# Patient Record
Sex: Male | Born: 1960
Health system: Southern US, Community
[De-identification: ages and names within clinical notes are randomized; demographics above are authoritative.]

## PROBLEM LIST (undated history)

## (undated) DIAGNOSIS — K219 Gastro-esophageal reflux disease without esophagitis: Secondary | ICD-10-CM

## (undated) DIAGNOSIS — R109 Unspecified abdominal pain: Secondary | ICD-10-CM

## (undated) HISTORY — DX: Unspecified abdominal pain: R10.9

## (undated) HISTORY — PX: POLYPECTOMY: SHX149

## (undated) HISTORY — PX: COLONOSCOPY: SHX174

---
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
## 1993-11-04 HISTORY — PX: KNEE ARTHROSCOPY W/ ACL RECONSTRUCTION: SHX1858

## 2005-11-04 HISTORY — PX: VASECTOMY: SHX75

## 2008-02-15 ENCOUNTER — Encounter: Payer: Self-pay | Admitting: Family Medicine

## 2009-02-14 ENCOUNTER — Ambulatory Visit: Payer: Self-pay | Admitting: Family Medicine

## 2009-02-14 DIAGNOSIS — R109 Unspecified abdominal pain: Secondary | ICD-10-CM | POA: Insufficient documentation

## 2009-02-14 HISTORY — DX: Unspecified abdominal pain: R10.9

## 2009-02-14 LAB — CONVERTED CEMR LAB
Bilirubin Urine: NEGATIVE
Blood in Urine, dipstick: NEGATIVE
Glucose, Urine, Semiquant: NEGATIVE
Ketones, urine, test strip: NEGATIVE
Nitrite: NEGATIVE
Specific Gravity, Urine: 1.025
Urobilinogen, UA: 0.2
WBC Urine, dipstick: NEGATIVE
pH: 5.5

## 2009-02-15 LAB — CONVERTED CEMR LAB
ALT: 26 units/L (ref 0–53)
AST: 17 units/L (ref 0–37)
Albumin: 4.3 g/dL (ref 3.5–5.2)
BUN: 13 mg/dL (ref 6–23)
Basophils Absolute: 0 10*3/uL (ref 0.0–0.1)
Chloride: 108 meq/L (ref 96–112)
Eosinophils Relative: 2.9 % (ref 0.0–5.0)
Glucose, Bld: 98 mg/dL (ref 70–99)
HCT: 42 % (ref 39.0–52.0)
Hemoglobin: 14.7 g/dL (ref 13.0–17.0)
Lymphs Abs: 1.8 10*3/uL (ref 0.7–4.0)
MCV: 89.1 fL (ref 78.0–100.0)
Monocytes Absolute: 0.5 10*3/uL (ref 0.1–1.0)
Neutro Abs: 2.8 10*3/uL (ref 1.4–7.7)
PSA: 0.47 ng/mL (ref 0.10–4.00)
Platelets: 234 10*3/uL (ref 150.0–400.0)
Potassium: 4.3 meq/L (ref 3.5–5.1)
RDW: 11.8 % (ref 11.5–14.6)
Sodium: 142 meq/L (ref 135–145)
TSH: 2.08 microintl units/mL (ref 0.35–5.50)
Total Bilirubin: 1 mg/dL (ref 0.3–1.2)

## 2009-11-28 ENCOUNTER — Telehealth: Payer: Self-pay | Admitting: Family Medicine

## 2009-11-29 ENCOUNTER — Ambulatory Visit: Payer: Self-pay | Admitting: Family Medicine

## 2009-11-29 DIAGNOSIS — N4889 Other specified disorders of penis: Secondary | ICD-10-CM | POA: Insufficient documentation

## 2009-11-29 LAB — CONVERTED CEMR LAB
Blood in Urine, dipstick: NEGATIVE
Ketones, urine, test strip: NEGATIVE
Nitrite: NEGATIVE
Protein, U semiquant: NEGATIVE
Urobilinogen, UA: 0.2
WBC Urine, dipstick: NEGATIVE

## 2009-12-18 ENCOUNTER — Encounter: Payer: Self-pay | Admitting: Family Medicine

## 2010-02-09 ENCOUNTER — Ambulatory Visit: Payer: Self-pay | Admitting: Family Medicine

## 2010-02-09 DIAGNOSIS — J029 Acute pharyngitis, unspecified: Secondary | ICD-10-CM

## 2010-02-09 LAB — CONVERTED CEMR LAB: Rapid Strep: NEGATIVE

## 2010-02-14 ENCOUNTER — Ambulatory Visit: Payer: Self-pay | Admitting: Family Medicine

## 2010-03-15 ENCOUNTER — Encounter: Payer: Self-pay | Admitting: Family Medicine

## 2010-10-08 ENCOUNTER — Ambulatory Visit: Payer: Self-pay | Admitting: Family Medicine

## 2010-10-08 DIAGNOSIS — M545 Low back pain: Secondary | ICD-10-CM

## 2010-10-08 LAB — CONVERTED CEMR LAB
Blood in Urine, dipstick: NEGATIVE
Ketones, urine, test strip: NEGATIVE
Nitrite: NEGATIVE
Protein, U semiquant: NEGATIVE
Specific Gravity, Urine: 1.015
Urobilinogen, UA: 0.2
WBC Urine, dipstick: NEGATIVE

## 2010-11-19 ENCOUNTER — Ambulatory Visit
Admission: RE | Admit: 2010-11-19 | Discharge: 2010-11-19 | Payer: Self-pay | Source: Home / Self Care | Attending: Family Medicine | Admitting: Family Medicine

## 2010-12-01 ENCOUNTER — Encounter: Payer: Self-pay | Admitting: Family Medicine

## 2010-12-04 NOTE — Progress Notes (Signed)
Summary: question for Dr.Milford Cilento  Phone Note Call from Patient   Caller: Patient Call For: Evelena Peat MD Summary of Call: Pt is calling to ask Dr. Caryl Never if he should see a UROLOGIST for a mass in his penis or come straight to Dr. Caryl Never? [phone] Initial call taken by: Lynann Beaver CMA,  November 28, 2009 1:06 PM  Follow-up for Phone Call        We can evaluate. Follow-up by: Evelena Peat MD,  November 28, 2009 1:39 PM

## 2010-12-04 NOTE — Assessment & Plan Note (Signed)
Summary: back pain//ccm   Vital Signs:  Patient profile:   50 year old male Weight:      217 pounds Temp:     98.7 degrees F oral BP sitting:   110 / 80  (left arm) Cuff size:   large  Vitals Entered By: Sid Falcon LPN (October 08, 2010 4:08 PM)  History of Present Illness: Patient seen for low back pain which started 2 days ago Saturday. No specific injury but did lots of yard work. Pain mostly right lower lumbar area with some radiation to the buttock. Pain is mostly 4/10 in severity but occasionally 10 out of 10 severity with movement. No improvement with ice. Did not try heat. Ibuprofen 600 mg helped somewhat. Denies any dysuria, fever, chills, appetite change, or weight change.  Allergies (verified): No Known Drug Allergies  Review of Systems      See HPI  Physical Exam  General:  Well-developed,well-nourished,in no acute distress; alert,appropriate and cooperative throughout examination Lungs:  Normal respiratory effort, chest expands symmetrically. Lungs are clear to auscultation, no crackles or wheezes. Heart:  Normal rate and regular rhythm. S1 and S2 normal without gallop, murmur, click, rub or other extra sounds. Abdomen:  Bowel sounds positive,abdomen soft and non-tender without masses, organomegaly or hernias noted. Extremities:  no edema. Straight leg raise is negative Neurologic:  alert & oriented X3.  full strength with plantar flexion dorsiflexion bilaterally. 1+ Achilles reflexes bilaterally and 1+ patella reflexes bilateral. Normal sensation to touch. Skin:  no rashes and no suspicious lesions.     Impression & Recommendations:  Problem # 1:  LUMBAGO (ICD-724.2) nonfocal neuro.  Cont ibuprofen and tramadol as needed.   heat for symptom relief.  UA unremarkable. Orders: UA Dipstick w/o Micro (manual) (16109)  His updated medication list for this problem includes:    Tramadol Hcl 50 Mg Tabs (Tramadol hcl) .Marland Kitchen... 1-2 by mouth q 6 hours as needed  pain.  Complete Medication List: 1)  Daily Mens Health Formula Tabs (Multiple vitamins-minerals) .... Once daily 2)  Tramadol Hcl 50 Mg Tabs (Tramadol hcl) .Marland Kitchen.. 1-2 by mouth q 6 hours as needed pain.  Patient Instructions: 1)  Most patients (90%) with low back pain will improve with time ( 2-6 weeks). Keep active but avoid activities that are painful. Apply moist heat and/or ice to lower back several times a day.  Prescriptions: TRAMADOL HCL 50 MG TABS (TRAMADOL HCL) 1-2 by mouth q 6 hours as needed pain.  #60 x 1   Entered and Authorized by:   Evelena Peat MD   Signed by:   Evelena Peat MD on 10/08/2010   Method used:   Print then Give to Patient   RxID:   [id number]    Orders Added: 1)  Est. Patient Level III [95621] 2)  UA Dipstick w/o Micro (manual) [81002]    Laboratory Results   Urine Tests    Routine Urinalysis   Color: yellow Appearance: Clear Glucose: negative   (Normal Range: Negative) Bilirubin: negative   (Normal Range: Negative) Ketone: negative   (Normal Range: Negative) Spec. Gravity: 1.015   (Normal Range: 1.003-1.035) Blood: negative   (Normal Range: Negative) pH: 5.5   (Normal Range: 5.0-8.0) Protein: negative   (Normal Range: Negative) Urobilinogen: 0.2   (Normal Range: 0-1) Nitrite: negative   (Normal Range: Negative) Leukocyte Esterace: negative   (Normal Range: Negative)    Comments: Rita Ohara  October 08, 2010 4:16 PM

## 2010-12-04 NOTE — Assessment & Plan Note (Signed)
Summary: not any better//ccm   Vital Signs:  Patient profile:   50 year old male Temp:     98.6 degrees F oral BP sitting:   110 / 90  (left arm) Cuff size:   regular  Vitals Entered By: Sid Falcon LPN (February 14, 2010 1:47 PM) CC: not any better, ongoing congestion, cough   History of Present Illness: Persistent cough now productive of green sputum. Still has occasional sore throat symptoms. No dyspnea. Denies any fever or chills. Nonsmoker. Hydrocodone cough medication is helping at night.  No hemoptysis.  Allergies (verified): No Known Drug Allergies  Review of Systems      See HPI  Physical Exam  General:  Well-developed,well-nourished,in no acute distress; alert,appropriate and cooperative throughout examination Ears:  External ear exam shows no significant lesions or deformities.  Otoscopic examination reveals clear canals, tympanic membranes are intact bilaterally without bulging, retraction, inflammation or discharge. Hearing is grossly normal bilaterally. Mouth:  Oral mucosa and oropharynx without lesions or exudates.  Teeth in good repair. Neck:  No deformities, masses, or tenderness noted. Lungs:  Normal respiratory effort, chest expands symmetrically. Lungs are clear to auscultation, no crackles or wheezes. Heart:  Normal rate and regular rhythm. S1 and S2 normal without gallop, murmur, click, rub or other extra sounds.   Impression & Recommendations:  Problem # 1:  ACUTE BRONCHITIS (ICD-466.0)  His updated medication list for this problem includes:    Hydrocodone-homatropine 5-1.5 Mg/78ml Syrp (Hydrocodone-homatropine) .Marland Kitchen... 1 tsp by mouth q 4-6 hours as needed cough    Azithromycin 250 Mg Tabs (Azithromycin) .Marland Kitchen... 2 by mouth today then one by mouth once daily for 4 days  Complete Medication List: 1)  Daily Mens Health Formula Tabs (Multiple vitamins-minerals) .... Once daily 2)  Hydrocodone-homatropine 5-1.5 Mg/55ml Syrp (Hydrocodone-homatropine) .Marland Kitchen.. 1 tsp by  mouth q 4-6 hours as needed cough 3)  Azithromycin 250 Mg Tabs (Azithromycin) .... 2 by mouth today then one by mouth once daily for 4 days  Patient Instructions: 1)  Acute Bronchitis symptoms for less then 10 days are not  helped by antibiotics. Take over the counter cough medications. Call if no improvement in 5-7 days, sooner if increasing cough, fever, or new symptoms ( shortness of breath, chest pain) .  Prescriptions: AZITHROMYCIN 250 MG TABS (AZITHROMYCIN) 2 by mouth today then one by mouth once daily for 4 days  #6 x 0   Entered and Authorized by:   Evelena Peat MD   Signed by:   Evelena Peat MD on 02/14/2010   Method used:   Print then Give to Patient   RxID:   [id number]

## 2010-12-04 NOTE — Assessment & Plan Note (Signed)
Summary: lump in genital area/per Dr. Charlton Amor   Vital Signs:  Patient profile:   50 year old male Temp:     98.9 degrees F oral BP sitting:   110 / 84  (left arm) Cuff size:   regular  Vitals Entered By: Sid Falcon LPN (November 29, 2009 3:51 PM)   History of Present Illness: Patient is seen with recently noted "lump" dorsum of penis about one week ago. He has not had any dysuria or hematuria. Possibly minimal discomfort. No skin changes. No history of erectile dysfunction. Denies any curvature of the penis with erection.  Allergies (verified): No Known Drug Allergies  Past History:  Past Surgical History: Last updated: 02/14/2009 Tonsillectomy  1970 Vasectomy  2007 Knee ACL 1995  Social History: Last updated: 02/14/2009 Occupation: Consulting civil engineer  Married Never Smoked  Review of Systems  The patient denies anorexia, fever, weight loss, abdominal pain, hematuria, incontinence, suspicious skin lesions, enlarged lymph nodes, and testicular masses.    Physical Exam  General:  Well-developed,well-nourished,in no acute distress; alert,appropriate and cooperative throughout examination Genitalia:  circumcised male. Glans appears nl.  He has some essentially nontender fibrous type tissue dorsal penis but no evidence for curvature.  no scrotal masses. Testes normal. No hernia. Skin:  Intact without suspicious lesions or rashes   Impression & Recommendations:  Problem # 1:  PENILE PAIN (ICD-607.89)  urinalysis unremarkable. Suspect benign fibrous tissue but will schedule urology appointment for second opinion.  No evidence for Peyronie's  Orders: UA Dipstick w/o Micro (manual) (30160) Urology Referral (Urology)  Complete Medication List: 1)  Daily Mens Health Formula Tabs (Multiple vitamins-minerals) .... Once daily  Laboratory Results   Urine Tests    Routine Urinalysis   Color: lt. yellow Appearance: Clear Glucose: negative   (Normal Range:  Negative) Bilirubin: negative   (Normal Range: Negative) Ketone: negative   (Normal Range: Negative) Spec. Gravity: <1.005   (Normal Range: 1.003-1.035) Blood: negative   (Normal Range: Negative) pH: 6.5   (Normal Range: 5.0-8.0) Protein: negative   (Normal Range: Negative) Urobilinogen: 0.2   (Normal Range: 0-1) Nitrite: negative   (Normal Range: Negative) Leukocyte Esterace: negative   (Normal Range: Negative)    Comments: Sid Falcon LPN  November 29, 2009 4:07 PM

## 2010-12-04 NOTE — Letter (Signed)
Summary: [facility]  [facility]   Imported By: Maryln Gottron 03/29/2010 14:07:09  _____________________________________________________________________  External Attachment:    Type:   Image     Comment:   External Document

## 2010-12-04 NOTE — Consult Note (Signed)
Summary: [facility]  [facility]   Imported By: Maryln Gottron 12/27/2009 10:56:30  _____________________________________________________________________  External Attachment:    Type:   Image     Comment:   External Document

## 2010-12-04 NOTE — Assessment & Plan Note (Signed)
Summary: cough/chest congestion/sore throat/cjr   Vital Signs:  Patient profile:   50 year old male Temp:     98.9 degrees F oral BP sitting:   110 / 82  (left arm) Cuff size:   large  Vitals Entered By: Sid Falcon LPN (February 09, 453 1:48 PM) CC: cough, chest congestion, sore throat   History of Present Illness: Onset 5 days ago chest congestion, mostly dry cough and intermittent sore throat. No fever. TheraFlu which helped his sore throat symptoms. Cough has persisted at night. No ill exposures. Denies any nausea, vomiting, or diarrhea. Nonsmoker. Sore throat symptoms worse after coughing.  Allergies (verified): No Known Drug Allergies  Review of Systems      See HPI  Physical Exam  General:  Well-developed,well-nourished,in no acute distress; alert,appropriate and cooperative throughout examination Ears:  External ear exam shows no significant lesions or deformities.  Otoscopic examination reveals clear canals, tympanic membranes are intact bilaterally without bulging, retraction, inflammation or discharge. Hearing is grossly normal bilaterally. Nose:  External nasal examination shows no deformity or inflammation. Nasal mucosa are pink and moist without lesions or exudates. Mouth:  evidence for previous tonsillectomy. Mild erythema without exudate Neck:  No deformities, masses, or tenderness noted. Lungs:  Normal respiratory effort, chest expands symmetrically. Lungs are clear to auscultation, no crackles or wheezes. Heart:  Normal rate and regular rhythm. S1 and S2 normal without gallop, murmur, click, rub or other extra sounds.   Impression & Recommendations:  Problem # 1:  VIRAL URI (ICD-465.9)  rapid strep negative. Take Alleve or Advil for symptom relief and cough medicine prescribed  His updated medication list for this problem includes:    Hydrocodone-homatropine 5-1.5 Mg/8ml Syrp (Hydrocodone-homatropine) .Marland Kitchen... 1 tsp by mouth q 4-6 hours as needed  cough  Complete Medication List: 1)  Daily Mens Health Formula Tabs (Multiple vitamins-minerals) .... Once daily 2)  Hydrocodone-homatropine 5-1.5 Mg/90ml Syrp (Hydrocodone-homatropine) .Marland Kitchen.. 1 tsp by mouth q 4-6 hours as needed cough  Other Orders: Rapid Strep (09811)  Patient Instructions: 1)  Consider Aleve 2 tablets every 12 hours as needed for sore throat symptoms Prescriptions: HYDROCODONE-HOMATROPINE 5-1.5 MG/5ML SYRP (HYDROCODONE-HOMATROPINE) 1 tsp by mouth q 4-6 hours as needed cough  #120 ml x 0   Entered and Authorized by:   Evelena Peat MD   Signed by:   Evelena Peat MD on 02/09/2010   Method used:   Print then Give to Patient   RxID:   [id number]   Laboratory Results  Date/Time Received: February 09, 2010 1:59 PM  Date/Time Reported: February 09, 2010 1:59 PM   Other Tests  Rapid Strep: negative Comments Wynona Canes, CMA  February 09, 2010 1:59 PM

## 2010-12-06 NOTE — Assessment & Plan Note (Signed)
Summary: injs for trip to [place] and script/forms per nancy/cjr   Vital Signs:  Patient profile:   50 year old male Temp:     98.1 degrees F oral BP sitting:   112 / 82  (left arm) Cuff size:   regular  Vitals Entered By: Sid Falcon LPN (November 19, 2010 12:24 PM)  History of Present Illness: Here for upcoming trip to [place] and needing to consult for immunizations.  Tetanus up to date. Flu vaccine needed.  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 Will need malaria and typhoid fever prevention.  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    Physical Exam  General:  Well-developed,well-nourished,in no acute distress; alert,appropriate and cooperative throughout examination   Impression & Recommendations:  Problem # 1:  OTHER REASONS FOR SEEKING CONSULTATION (ICD-V65.8) flu vaccine given.  Twinrix given and will return as instructed to complete series. Cipro for travelers diarrhea and discussed measures to reduce. Malarone for malaria prevention and Vivotif for typhoid with instructions for use.  Complete Medication List: 1)  Daily Mens Health Formula Tabs (Multiple vitamins-minerals) .... Once daily 2)  Tramadol Hcl 50 Mg Tabs (Tramadol hcl) .Marland Kitchen.. 1-2 by mouth q 6 hours as needed pain. 3)  Vivotif Berna Vaccine Cpdr (Typhoid vaccine) .... One by mouth every other day for 4 doses and take at least 2 weeks prior to  travel 4)  Malarone 250-100 Mg Tabs (Atovaquone-proguanil hcl) .... One by mouth once daily starting 2 days prior to travel, during travel, and for one week after return  Other Orders: Flu Vaccine 45yrs + (13244) Admin 1st Vaccine (01027) TwinRix 1ml ( Hep A&B Adult dose) (25366) Admin of Any Addtl Vaccine (44034)  Patient Instructions: 1)  Consider follow up in 6 months for repeat Hep A and 2 months if you decide to pursue Hep B series. Prescriptions: MALARONE 250-100 MG TABS (ATOVAQUONE-PROGUANIL HCL) one by mouth once daily starting 2 days prior to travel, during travel, and for one week after return  #20 x 0   Entered and Authorized by:   Evelena Peat MD   Signed by:   Evelena Peat MD on 11/19/2010   Method used:   [facility]        [facility] [address]ail[facility]       [address]       [place], [place]  [address]       Ph: [phone] or [phone]       Fax: [phone]   RxID:   [id number] VIVOTIF BERNA VACCINE  CPDR (TYPHOID VACCINE) one by mouth every other day for 4 doses and take at least 2 weeks prior to travel  #4 x 0   Entered and Authorized by:   Evelena Peat MD   Signed by:   Evelena Peat MD on 11/19/2010   Method used:   [facility]        [facility] [address] [phone] (retail[facility]       [address]  [place], [place]  [address]       Ph: [phone] or [phone]       Fax: [phone]   RxID:   [id number]    Orders Added: 1)  Flu Vaccine 71yrs + [16606] 2)  Admin 1st Vaccine [90471] 3)  TwinRix 1ml ( Hep A&B Adult dose) [90636] 4)  Admin of Any Addtl Vaccine [90472] 5)  Est. Patient Level III [30160]   Immunization History:  Tetanus/Td Immunization History:    Tetanus/Td:  historical (11/04/2006)  Immunizations Administered:  Influenza Vaccine # 1:    Vaccine Type: Fluvax 3+    Site: right deltoid    Mfr: GlaxoSmithKline    Dose: 0.5 ml    Route: IM    Given by: Sid Falcon LPN    Exp. Date: 04/05/2011     Lot #: [id number]    VIS given: 05/29/10 version given November 19, 2010.  TwinRix # 1:    Vaccine Type: TwinRix    Site: left deltoid    Mfr: GlaxoSmithKline    Dose: 1.0 ml    Route: IM    Given by: Sid Falcon LPN    Exp. Date: 04/04/2012    Lot #: [id number]    VIS given: 07/23/07 version given November 19, 2010.  Flu Vaccine Consent Questions:    Do you have a history of severe allergic reactions to this vaccine? no    Any prior history of allergic reactions to egg and/or gelatin? no    Do you have a sensitivity to the preservative Thimersol? no    Do you have a past history of Guillan-Barre Syndrome? no    Do you currently have an acute febrile illness? no    Have you ever had a severe reaction to latex? no    Vaccine information given and explained to patient? yes   Immunization History:  Tetanus/Td Immunization History:    Tetanus/Td:  Historical (11/04/2006)  Immunizations Administered:  Influenza Vaccine # 1:    Vaccine Type: Fluvax 3+    Site: right deltoid    Mfr: GlaxoSmithKline    Dose: 0.5 ml    Route: IM    Given by: Sid Falcon LPN    Exp. Date: 04/05/2011    Lot #: [id number]    VIS given: 05/29/10 version given November 19, 2010.  TwinRix # 1:    Vaccine Type: TwinRix    Site: left deltoid    Mfr: GlaxoSmithKline    Dose: 1.0 ml    Route: IM    Given by: Sid Falcon LPN    Exp. Date: 04/04/2012    Lot #: [id number]    VIS given: 07/23/07 version given November 19, 2010.

## 2010-12-17 ENCOUNTER — Ambulatory Visit: Payer: Managed Care, Other (non HMO) | Admitting: Family Medicine

## 2010-12-17 DIAGNOSIS — Z23 Encounter for immunization: Secondary | ICD-10-CM

## 2010-12-17 DIAGNOSIS — Z299 Encounter for prophylactic measures, unspecified: Secondary | ICD-10-CM

## 2010-12-19 ENCOUNTER — Telehealth: Payer: Self-pay | Admitting: Family Medicine

## 2010-12-19 DIAGNOSIS — Z299 Encounter for prophylactic measures, unspecified: Secondary | ICD-10-CM

## 2010-12-19 MED ORDER — SCOPOLAMINE 1 MG/3DAYS TD PT72: 1.0000 | MEDICATED_PATCH | TRANSDERMAL | Status: DC

## 2010-12-19 NOTE — Telephone Encounter (Signed)
Need rx for motion sickness patches sent to [facility]. ° °

## 2010-12-19 NOTE — Telephone Encounter (Signed)
Scopolamine patch one patch behind ear every 3 days.  May dispense # 6 (this should cover pt and his wife)

## 2010-12-19 NOTE — Telephone Encounter (Signed)
Please advise 

## 2011-05-06 ENCOUNTER — Ambulatory Visit (INDEPENDENT_AMBULATORY_CARE_PROVIDER_SITE_OTHER): Payer: Managed Care, Other (non HMO) | Admitting: Family Medicine

## 2011-05-06 ENCOUNTER — Encounter: Payer: Self-pay | Admitting: Family Medicine

## 2011-05-06 VITALS — BP 110/82 | Temp 99.3°F | Ht 69.0 in | Wt 216.0 lb

## 2011-05-06 DIAGNOSIS — B349 Viral infection, unspecified: Secondary | ICD-10-CM

## 2011-05-06 DIAGNOSIS — B9789 Other viral agents as the cause of diseases classified elsewhere: Secondary | ICD-10-CM

## 2011-05-06 MED ORDER — HYDROCODONE-HOMATROPINE 5-1.5 MG/5ML PO SYRP: 5.0000 mL | ORAL_SOLUTION | Freq: Four times a day (QID) | ORAL | Status: AC | PRN

## 2011-05-06 NOTE — Patient Instructions (Signed)
Consider over the counter sudafed 60 mg every 6-8 hours as needed for congestion.

## 2011-05-06 NOTE — Progress Notes (Signed)
  Subjective:    Patient ID: Joshua Ellison, male    DOB: [birth year], 50 y.o.   MRN: [id number]  HPI Patient seen with infectious type symptoms. Started last week with some cough mostly nonproductive. He has some malaise and mostly nasal congestion and increased sinus pressure. He has taken DayQuil and Advil sinus without much improvement. Mild sore throat. No fever. No system body aches. He had some difficulty sleeping secondary to cough. He is nonsmoker.   Review of Systems  HENT: Positive for congestion, sore throat, rhinorrhea, postnasal drip and sinus pressure.   Respiratory: Positive for cough. Negative for shortness of breath and wheezing.   Skin: Negative for rash.       Objective:   Physical Exam  Constitutional: He appears well-developed and well-nourished.  HENT:  Right Ear: External ear normal.  Left Ear: External ear normal.  Mouth/Throat: Oropharynx is clear and moist. No oropharyngeal exudate.  Neck: Neck supple.  Cardiovascular: Normal rate, regular rhythm and normal heart sounds.   Pulmonary/Chest: Effort normal and breath sounds normal. No respiratory distress. He has no wheezes. He has no rales.  Musculoskeletal: He exhibits no edema.  Lymphadenopathy:    He has no cervical adenopathy.  Psychiatric: He has a normal mood and affect.          Assessment & Plan:  Probable viral syndrome. Hycodan for cough suppression. Consider over-the-counter Sudafed. Followup if symptoms worsen or persist

## 2011-07-17 ENCOUNTER — Telehealth: Payer: Self-pay | Admitting: *Deleted

## 2011-07-17 NOTE — Telephone Encounter (Signed)
No.  Not too early.

## 2011-07-17 NOTE — Telephone Encounter (Signed)
Notified pt. 

## 2011-07-17 NOTE — Telephone Encounter (Signed)
Pt. Took his last flu vaccine 11/2010, and has a chance to get one at work now, and wants to know if it is too early.

## 2011-07-25 ENCOUNTER — Ambulatory Visit (INDEPENDENT_AMBULATORY_CARE_PROVIDER_SITE_OTHER): Payer: Managed Care, Other (non HMO) | Admitting: Family Medicine

## 2011-07-25 DIAGNOSIS — Z23 Encounter for immunization: Secondary | ICD-10-CM

## 2011-07-25 DIAGNOSIS — Z299 Encounter for prophylactic measures, unspecified: Secondary | ICD-10-CM

## 2012-01-29 ENCOUNTER — Ambulatory Visit (INDEPENDENT_AMBULATORY_CARE_PROVIDER_SITE_OTHER): Payer: Managed Care, Other (non HMO) | Admitting: Family Medicine

## 2012-01-29 ENCOUNTER — Encounter: Payer: Self-pay | Admitting: Family Medicine

## 2012-01-29 VITALS — BP 112/70 | HR 63 | Temp 99.0°F | Wt 218.0 lb

## 2012-01-29 DIAGNOSIS — J40 Bronchitis, not specified as acute or chronic: Secondary | ICD-10-CM

## 2012-01-29 MED ORDER — AZITHROMYCIN 250 MG PO TABS: ORAL_TABLET | ORAL | Status: AC

## 2012-01-29 NOTE — Progress Notes (Signed)
  Subjective:    Patient ID: Kyren Knick, male    DOB: April 30, 1961, 51 y.o.   MRN: [id number]  HPI Here for 4 days of PND, ST, and coughing up green sputum. No fever.    Review of Systems  Constitutional: Negative.   HENT: Positive for congestion, postnasal drip and sinus pressure.   Eyes: Negative.   Respiratory: Positive for cough.        Objective:   Physical Exam  Constitutional: He appears well-developed and well-nourished.  HENT:  Right Ear: External ear normal.  Left Ear: External ear normal.  Nose: Nose normal.  Mouth/Throat: Oropharynx is clear and moist. No oropharyngeal exudate.  Eyes: Conjunctivae are normal.  Pulmonary/Chest: Effort normal and breath sounds normal.  Lymphadenopathy:    He has no cervical adenopathy.          Assessment & Plan:  Add Mucinex

## 2012-02-14 ENCOUNTER — Other Ambulatory Visit: Payer: Self-pay | Admitting: Family Medicine

## 2012-04-01 ENCOUNTER — Encounter (HOSPITAL_COMMUNITY): Payer: Self-pay | Admitting: Emergency Medicine

## 2012-04-01 ENCOUNTER — Emergency Department (HOSPITAL_COMMUNITY)
Admission: EM | Admit: 2012-04-01 | Discharge: 2012-04-01 | Disposition: A | Discharge status: Home / Self Care | Payer: Managed Care, Other (non HMO) | Attending: Emergency Medicine | Admitting: Emergency Medicine

## 2012-04-01 DIAGNOSIS — Y998 Other external cause status: Secondary | ICD-10-CM | POA: Insufficient documentation

## 2012-04-01 DIAGNOSIS — S0100XA Unspecified open wound of scalp, initial encounter: Secondary | ICD-10-CM | POA: Insufficient documentation

## 2012-04-01 DIAGNOSIS — IMO0002 Reserved for concepts with insufficient information to code with codable children: Secondary | ICD-10-CM | POA: Insufficient documentation

## 2012-04-01 DIAGNOSIS — S0101XA Laceration without foreign body of scalp, initial encounter: Secondary | ICD-10-CM

## 2012-04-01 DIAGNOSIS — Y9301 Activity, walking, marching and hiking: Secondary | ICD-10-CM | POA: Insufficient documentation

## 2012-04-01 DIAGNOSIS — Z87891 Personal history of nicotine dependence: Secondary | ICD-10-CM | POA: Insufficient documentation

## 2012-04-01 MED ORDER — IBUPROFEN 200 MG PO TABS
400.0000 mg | ORAL_TABLET | Freq: Once | ORAL | Status: AC
  Administered 2012-04-01: 400 mg via ORAL
  Filled 2012-04-01: qty 2

## 2012-04-01 NOTE — ED Notes (Signed)
D/c instructions reviewed w/ pt and family - pt and family deny any further questions or concerns at present. Pt ambulating independently w/ steady gait on d/c in no acute distress, A&Ox4.  

## 2012-04-01 NOTE — ED Notes (Signed)
Patient states that he walked into [place].  Patient with approx 1" laceration on top of left head.  CAOx3, no LOC, full recall, bleeding controlled.

## 2012-04-01 NOTE — ED Provider Notes (Signed)
History     CSN: [id number]  Arrival date & time 04/01/12  2135   First MD Initiated Contact with Patient 04/01/12 2211      Chief Complaint  Patient presents with  . Head Laceration    (Consider location/radiation/quality/duration/timing/severity/associated sxs/prior treatment) Patient is a 51 y.o. male presenting with scalp laceration. The history is provided by the patient.  Head Laceration This is a new problem. The current episode started today. The problem occurs constantly. Pertinent negatives include no headaches or weakness.  Accidentally walked into overhang on camper. No LOC, full recall of events. No HA, change in vision, dizziness. Bleeding controlled in triage. Not on any blood thinners. Last tetanus 1 year ago.  Past Medical History  Diagnosis Date  . SUPRAPUBIC PAIN 02/14/2009    Past Surgical History  Procedure Date  . Tonsillectomy 1970  . Vasectomy 2007  . Knee arthroscopy w/ acl reconstruction 1995    History reviewed. No pertinent family history.  History  Substance Use Topics  . Smoking status: Former Games developer  . Smokeless tobacco: Not on file  . Alcohol Use: No     socially      Review of Systems  Eyes: Negative for visual disturbance.  Musculoskeletal: Negative for gait problem.  Skin: Positive for wound.  Neurological: Negative for dizziness, syncope, speech difficulty, weakness and headaches.  Psychiatric/Behavioral: Negative for confusion.    Allergies  Review of patient's allergies indicates no known allergies.  Home Medications   Current Outpatient Rx  Name Route Sig Dispense Refill  . IBUPROFEN 200 MG PO TABS Oral Take 600 mg by mouth every 6 (six) hours as needed. For pain    . DAILY-VITE MENS FORMULA PO TABS Oral Take by mouth.      . TRANSDERM-SCOP 1.5 MG TD PT72  PLACE 1 PATCH ONTO THE SKIN EVERY THIRD DAY 6 each 0    BP 131/85  Pulse 77  Temp(Src) 98.8 F (37.1 C) (Oral)  Resp 16  SpO2 96%  Physical Exam  Nursing  note and vitals reviewed. Constitutional: He is oriented to person, place, and time. He appears well-developed and well-nourished.  HENT:  Head: Normocephalic.    Right Ear: External ear normal.  Left Ear: External ear normal.  Eyes: Pupils are equal, round, and reactive to light.  Neck: Neck supple.  Cardiovascular: Normal rate and regular rhythm.   Pulmonary/Chest: Effort normal. No respiratory distress.  Abdominal: He exhibits no distension.  Musculoskeletal: He exhibits no edema.  Neurological: He is alert and oriented to person, place, and time. Cranial nerve deficit: 3-12 intact.       Normal gait. Speech clear. MAEW.  Skin: Skin is warm and dry.       See HENT exam    ED Course  Procedures (including critical care time)  Labs Reviewed - No data to display No results found.  LACERATION REPAIR Performed by: Lorenz Coaster Consent: Verbal consent obtained. Risks and benefits: risks, benefits and alternatives were discussed Patient identity confirmed: provided demographic data Time out performed prior to procedure Prepped and Draped in normal sterile fashion Wound explored  Laceration Location: left frontoparietal region  Laceration Length: 3.5cm  No Foreign Bodies seen or palpated  Anesthesia: none   Irrigation method: syringe Amount of cleaning: standard  Skin closure: staples  Number of sutures or staples: 2  Technique: staples  Patient tolerance: Patient tolerated the procedure well with no immediate complications.  Dx 1: Scalp laceration   MDM  Head laceration.  No LOC. No neck pain. Non-focal neuro exam. Laceration repaired with staples. Tetanus UTD. Will d/c home.         Shaaron Adler, PA-C 04/01/12 2241

## 2012-04-01 NOTE — ED Notes (Signed)
Pt reports hitting his head on his camper approx 2hrs ago - denies LOC or ETOH use. Pt controlled bleeding prior to arrival - pt A&Ox4, no neuro deficits noted on assessment. Family at bedside x1. Lorenz Coaster, PAC at bedside to staple pt's wound.

## 2012-04-01 NOTE — Discharge Instructions (Signed)
Your scalp laceration was repaired with staples today. If you begin to develop visual change, severe headache, persistent vomiting, trouble walking, or confusion you should return to the ER for re-evaluation immediately.     Laceration Care, Adult A laceration is a cut or lesion that goes through all layers of the skin and into the tissue just beneath the skin. TREATMENT  Some lacerations may not require closure. Some lacerations may not be able to be closed due to an increased risk of infection. It is important to see your caregiver as soon as possible after an injury to minimize the risk of infection and maximize the opportunity for successful closure. If closure is appropriate, pain medicines may be given, if needed. The wound will be cleaned to help prevent infection. Your caregiver will use stitches (sutures), staples, wound glue (adhesive), or skin adhesive strips to repair the laceration. These tools bring the skin edges together to allow for faster healing and a better cosmetic outcome. However, all wounds will heal with a scar. Once the wound has healed, scarring can be minimized by covering the wound with sunscreen during the day for 1 full year. HOME CARE INSTRUCTIONS  For sutures or staples:  Keep the wound clean and dry.   If you were given a bandage (dressing), you should change it at least once a day. Also, change the dressing if it becomes wet or dirty, or as directed by your caregiver.   Wash the wound with soap and water 2 times a day. Rinse the wound off with water to remove all soap. Pat the wound dry with a clean towel.   After cleaning, apply a thin layer of the antibiotic ointment as recommended by your caregiver. This will help prevent infection and keep the dressing from sticking.   You may shower as usual after the first 24 hours. Do not soak the wound in water until the sutures are removed.   Only take over-the-counter or prescription medicines for pain, discomfort,  or fever as directed by your caregiver.   Get your sutures or staples removed as directed by your caregiver.  For skin adhesive strips:  Keep the wound clean and dry.   Do not get the skin adhesive strips wet. You may bathe carefully, using caution to keep the wound dry.   If the wound gets wet, pat it dry with a clean towel.   Skin adhesive strips will fall off on their own. You may trim the strips as the wound heals. Do not remove skin adhesive strips that are still stuck to the wound. They will fall off in time.  For wound adhesive:  You may briefly wet your wound in the shower or bath. Do not soak or scrub the wound. Do not swim. Avoid periods of heavy perspiration until the skin adhesive has fallen off on its own. After showering or bathing, gently pat the wound dry with a clean towel.   Do not apply liquid medicine, cream medicine, or ointment medicine to your wound while the skin adhesive is in place. This may loosen the film before your wound is healed.   If a dressing is placed over the wound, be careful not to apply tape directly over the skin adhesive. This may cause the adhesive to be pulled off before the wound is healed.   Avoid prolonged exposure to sunlight or tanning lamps while the skin adhesive is in place. Exposure to ultraviolet light in the first year will darken the scar.  The skin adhesive will usually remain in place for 5 to 10 days, then naturally fall off the skin. Do not pick at the adhesive film.  You may need a tetanus shot if:  You cannot remember when you had your last tetanus shot.   You have never had a tetanus shot.  If you get a tetanus shot, your arm may swell, get red, and feel warm to the touch. This is common and not a problem. If you need a tetanus shot and you choose not to have one, there is a rare chance of getting tetanus. Sickness from tetanus can be serious. SEEK MEDICAL CARE IF:   You have redness, swelling, or increasing pain in the  wound.   You see a red line that goes away from the wound.   You have yellowish-white fluid (pus) coming from the wound.   You have a fever.   You notice a bad smell coming from the wound or dressing.   Your wound breaks open before or after sutures have been removed.   You notice something coming out of the wound such as wood or glass.   Your wound is on your hand or foot and you cannot move a finger or toe.  SEEK IMMEDIATE MEDICAL CARE IF:   Your pain is not controlled with prescribed medicine.   You have severe swelling around the wound causing pain and numbness or a change in color in your arm, hand, leg, or foot.   Your wound splits open and starts bleeding.   You have worsening numbness, weakness, or loss of function of any joint around or beyond the wound.   You develop painful lumps near the wound or on the skin anywhere on your body.  MAKE SURE YOU:   Understand these instructions.   Will watch your condition.   Will get help right away if you are not doing well or get worse.  Document Released: 10/21/2005 Document Revised: 10/10/2011 Document Reviewed: 04/16/2011 [facility] Patient Information 2012 [place], [place].

## 2012-04-02 NOTE — ED Provider Notes (Signed)
Medical screening examination/treatment/procedure(s) were performed by non-physician practitioner and as supervising physician I was immediately available for consultation/collaboration.   Glynn Octave, MD 04/02/12 [phone]

## 2012-04-08 ENCOUNTER — Telehealth: Payer: Self-pay | Admitting: Family Medicine

## 2012-04-08 NOTE — Telephone Encounter (Signed)
Pt has sutures in scalp. Pt would like to pick up suture kit and have his wife candy who is RN removal sutures

## 2012-04-08 NOTE — Telephone Encounter (Signed)
Pt informed on VM

## 2012-04-08 NOTE — Telephone Encounter (Signed)
I would be OK with her removing but I suspect they can get small scissors in drug store.  We would have to charge for suture kit.

## 2012-04-10 ENCOUNTER — Ambulatory Visit (INDEPENDENT_AMBULATORY_CARE_PROVIDER_SITE_OTHER): Payer: Managed Care, Other (non HMO) | Admitting: Family Medicine

## 2012-04-10 ENCOUNTER — Encounter: Payer: Self-pay | Admitting: Family Medicine

## 2012-04-10 VITALS — BP 100/78 | Temp 98.0°F | Wt 216.0 lb

## 2012-04-10 DIAGNOSIS — S0100XA Unspecified open wound of scalp, initial encounter: Secondary | ICD-10-CM

## 2012-04-10 DIAGNOSIS — S0101XA Laceration without foreign body of scalp, initial encounter: Secondary | ICD-10-CM

## 2012-04-10 NOTE — Progress Notes (Signed)
  Subjective:    Patient ID: Joshua Ellison, male    DOB: Apr 30, 1961, 51 y.o.   MRN: [id number]  HPI  Scalp laceration 9 days ago. Stood up under sharp object. No loss of consciousness. Laceration left frontal region. No residual effects. Went to the emergency room. Staples x2. No headaches. No dizziness.  Review of Systems  Constitutional: Negative for fever and chills.  Neurological: Negative for headaches.       Objective:   Physical Exam  Constitutional: He appears well-developed and well-nourished.  HENT:       Patient has well-healed laceration left frontal scalp. No erythema or swelling. 2 staples were removed without difficulty          Assessment & Plan:  Laceration scalp. Staple removal. Followup as needed

## 2013-01-11 ENCOUNTER — Ambulatory Visit (INDEPENDENT_AMBULATORY_CARE_PROVIDER_SITE_OTHER): Payer: BC Managed Care – PPO | Admitting: Family Medicine

## 2013-01-11 ENCOUNTER — Encounter: Payer: Self-pay | Admitting: Family Medicine

## 2013-01-11 VITALS — BP 100/70 | Temp 98.6°F | Wt 226.0 lb

## 2013-01-11 DIAGNOSIS — R42 Dizziness and giddiness: Secondary | ICD-10-CM

## 2013-01-11 NOTE — Patient Instructions (Addendum)
Vertigo Vertigo means you feel like you or your surroundings are moving when they are not. Vertigo can be dangerous if it occurs when you are at work, driving, or performing difficult activities.  CAUSES  Vertigo occurs when there is a conflict of signals sent to your brain from the visual and sensory systems in your body. There are many different causes of vertigo, including:  Infections, especially in the inner ear.  A bad reaction to a drug or misuse of alcohol and medicines.  Withdrawal from drugs or alcohol.  Rapidly changing positions, such as lying down or rolling over in bed.  A migraine headache.  Decreased blood flow to the brain.  Increased pressure in the brain from a head injury, infection, tumor, or bleeding. SYMPTOMS  You may feel as though the world is spinning around or you are falling to the ground. Because your balance is upset, vertigo can cause nausea and vomiting. You may have involuntary eye movements (nystagmus). DIAGNOSIS  Vertigo is usually diagnosed by physical exam. If the cause of your vertigo is unknown, your caregiver may perform imaging tests, such as an MRI scan (magnetic resonance imaging). TREATMENT  Most cases of vertigo resolve on their own, without treatment. Depending on the cause, your caregiver may prescribe certain medicines. If your vertigo is related to body position issues, your caregiver may recommend movements or procedures to correct the problem. In rare cases, if your vertigo is caused by certain inner ear problems, you may need surgery. HOME CARE INSTRUCTIONS   Follow your caregiver's instructions.  Avoid driving.  Avoid operating heavy machinery.  Avoid performing any tasks that would be dangerous to you or others during a vertigo episode.  Tell your caregiver if you notice that certain medicines seem to be causing your vertigo. Some of the medicines used to treat vertigo episodes can actually make them worse in some people. SEEK  IMMEDIATE MEDICAL CARE IF:   Your medicines do not relieve your vertigo or are making it worse.  You develop problems with talking, walking, weakness, or using your arms, hands, or legs.  You develop severe headaches.  Your nausea or vomiting continues or gets worse.  You develop visual changes.  A family member notices behavioral changes.  Your condition gets worse. MAKE SURE YOU:  Understand these instructions.  Will watch your condition.  Will get help right away if you are not doing well or get worse. Document Released: 07/31/2005 Document Revised: 01/13/2012 Document Reviewed: 05/09/2011 [facility] Patient Information 2013 [place], [place].  Consider meclizine (OTC) for any recurrent nausea or vertigo.

## 2013-01-11 NOTE — Progress Notes (Signed)
  Subjective:    Patient ID: Joshua Ellison, male    DOB: [birth year], 52 y.o.   MRN: [id number]  HPI Acute  Vertigo and nausea this morning.  Vertigo better now. Symptoms occurred with movement. No hearing loss.  ?fever but temp not taken.  No sore throat or cough.  Some nasal congestion. Mild headache this AM relieved with Ibuprofen. Patient denies any visual changes, speech changes, focal weakness, or any other neurologic symptoms  Vertigo worse with movement.  Relieved with staying still.  Past Medical History  Diagnosis Date  . SUPRAPUBIC PAIN 02/14/2009   Past Surgical History  Procedure Laterality Date  . Tonsillectomy  1970  . Vasectomy  2007  . Knee arthroscopy w/ acl reconstruction  1995    reports that he has quit smoking. He does not have any smokeless tobacco history on file. He reports that he does not drink alcohol or use illicit drugs. family history is not on file. No Known Allergies    Review of Systems  Constitutional: Positive for fever. Negative for chills.  HENT: Positive for congestion and sinus pressure. Negative for hearing loss, ear pain, sore throat, neck stiffness and voice change.   Respiratory: Negative for cough and shortness of breath.   Neurological: Positive for dizziness and headaches. Negative for syncope.       Objective:   Physical Exam  Constitutional: He is oriented to person, place, and time. He appears well-developed and well-nourished.  HENT:  Mouth/Throat: Oropharynx is clear and moist.  Moderate cerumen both canals  Eyes: Pupils are equal, round, and reactive to light.  Neck: Neck supple. No thyromegaly present.  Cardiovascular: Normal rate and regular rhythm.  Exam reveals no gallop.   No murmur heard. Pulmonary/Chest: Effort normal and breath sounds normal. No respiratory distress. He has no wheezes. He has no rales.  Lymphadenopathy:    He has no cervical adenopathy.  Neurological: He is alert and oriented to person, place,  and time. No cranial nerve deficit.  No focal weakness. Cerebellar function normal. Gait normal          Assessment & Plan:  Transient vertigo. Suspect viral illness. Doubt influenza. He does not have any fever currently. Symptoms are currently stable and exam is nonfocal. Consider over-the-counter meclizine if he has any recurrent nausea associated with vertigo. Handout vertigo given

## 2013-05-17 ENCOUNTER — Ambulatory Visit (INDEPENDENT_AMBULATORY_CARE_PROVIDER_SITE_OTHER): Payer: BC Managed Care – PPO | Admitting: Family Medicine

## 2013-05-17 ENCOUNTER — Encounter: Payer: Self-pay | Admitting: Family Medicine

## 2013-05-17 VITALS — BP 130/78 | HR 68 | Temp 98.2°F | Wt 218.0 lb

## 2013-05-17 DIAGNOSIS — R1011 Right upper quadrant pain: Secondary | ICD-10-CM

## 2013-05-17 LAB — CBC WITH DIFFERENTIAL/PLATELET
Basophils Absolute: 0 10*3/uL (ref 0.0–0.1)
Eosinophils Absolute: 0.1 10*3/uL (ref 0.0–0.7)
HCT: 41.6 % (ref 39.0–52.0)
Hemoglobin: 14.4 g/dL (ref 13.0–17.0)
Lymphs Abs: 1 10*3/uL (ref 0.7–4.0)
MCHC: 34.6 g/dL (ref 30.0–36.0)
Monocytes Absolute: 1 10*3/uL (ref 0.1–1.0)
Neutro Abs: 2.9 10*3/uL (ref 1.4–7.7)
Platelets: 193 10*3/uL (ref 150.0–400.0)
RDW: 13 % (ref 11.5–14.6)

## 2013-05-17 LAB — LIPASE: Lipase: 32 U/L (ref 11.0–59.0)

## 2013-05-17 LAB — HEPATIC FUNCTION PANEL
Albumin: 4.4 g/dL (ref 3.5–5.2)
Total Bilirubin: 0.9 mg/dL (ref 0.3–1.2)

## 2013-05-17 LAB — BASIC METABOLIC PANEL
CO2: 26 mEq/L (ref 19–32)
Chloride: 103 mEq/L (ref 96–112)
Creatinine, Ser: 1 mg/dL (ref 0.4–1.5)
Potassium: 4.1 mEq/L (ref 3.5–5.1)
Sodium: 136 mEq/L (ref 135–145)

## 2013-05-17 NOTE — Patient Instructions (Addendum)
Follow up promptly for any fever or worsening abdominal pain

## 2013-05-17 NOTE — Progress Notes (Signed)
  Subjective:    Patient ID: Joshua Ellison, male    DOB: 03-14-1961, 52 y.o.   MRN: [id number]  HPI Acute visit Patient seen with abdominal pain diffuse upper abdomen with 2 distinct episodes. First occurred Sunday morning around 3 AM. He woke up with achy quality pain 6-7/10 in severity with radiation toward back. He recalls having some sweats but no definite fever. He then had a very similar episode last night. Denies any nausea or vomiting. Took some TUMS with minimal relief. Symptoms lasted couple hours duration Denies any recent GERD symptoms. No cough. No chest pain. No exertional symptoms No clear exacerbating features. No recent appetite or weight changes.  Past Medical History  Diagnosis Date  . SUPRAPUBIC PAIN 02/14/2009   Past Surgical History  Procedure Laterality Date  . Tonsillectomy  1970  . Vasectomy  2007  . Knee arthroscopy w/ acl reconstruction  1995    reports that he has quit smoking. He does not have any smokeless tobacco history on file. He reports that he does not drink alcohol or use illicit drugs. family history is not on file. No Known Allergies    Review of Systems  Constitutional: Positive for diaphoresis. Negative for fever, chills, appetite change and unexpected weight change.  HENT: Negative for trouble swallowing.   Respiratory: Negative for cough and shortness of breath.   Cardiovascular: Negative for chest pain.  Gastrointestinal: Positive for abdominal pain. Negative for nausea, vomiting, diarrhea and blood in stool.  Neurological: Negative for dizziness.       Objective:   Physical Exam  Constitutional: He appears well-developed and well-nourished.  HENT:  Mouth/Throat: Oropharynx is clear and moist.  Cardiovascular: Normal rate and regular rhythm.   Pulmonary/Chest: Effort normal and breath sounds normal. No respiratory distress. He has no wheezes. He has no rales.  Abdominal: Soft. Bowel sounds are normal. He exhibits no distension and  no mass. There is tenderness. There is no rebound and no guarding.          Assessment & Plan:  Abdominal pain right upper quadrant greater than left. Rule out symptomatic gallstones. Obtain ultrasound. Obtain labs with basic metabolic panel, CBC, hepatic panel, and serum amylase. Avoid high fat foods for now. Followup promptly for any fever or worsening symptoms. He'll try over-the-counter Nexium or Prilosec in the meantime but doubt this is GERD related

## 2013-05-18 ENCOUNTER — Encounter: Payer: Self-pay | Admitting: Family Medicine

## 2013-05-18 ENCOUNTER — Ambulatory Visit
Admission: RE | Admit: 2013-05-18 | Discharge: 2013-05-18 | Disposition: A | Discharge status: Home / Self Care | Payer: BC Managed Care – PPO | Source: Ambulatory Visit | Attending: Family Medicine | Admitting: Family Medicine

## 2013-05-18 DIAGNOSIS — R1011 Right upper quadrant pain: Secondary | ICD-10-CM

## 2013-05-20 ENCOUNTER — Encounter: Payer: Self-pay | Admitting: Family Medicine

## 2013-09-09 ENCOUNTER — Other Ambulatory Visit: Payer: Self-pay

## 2013-11-29 ENCOUNTER — Ambulatory Visit (INDEPENDENT_AMBULATORY_CARE_PROVIDER_SITE_OTHER): Payer: BC Managed Care – PPO | Admitting: Family Medicine

## 2013-11-29 ENCOUNTER — Encounter: Payer: Self-pay | Admitting: Family Medicine

## 2013-11-29 VITALS — BP 124/80 | HR 78 | Temp 98.5°F | Wt 227.0 lb

## 2013-11-29 DIAGNOSIS — J029 Acute pharyngitis, unspecified: Secondary | ICD-10-CM

## 2013-11-29 DIAGNOSIS — Z0000 Encounter for general adult medical examination without abnormal findings: Secondary | ICD-10-CM

## 2013-11-29 LAB — POCT RAPID STREP A (OFFICE): Rapid Strep A Screen: NEGATIVE

## 2013-11-29 NOTE — Progress Notes (Signed)
   Subjective:    Patient ID: Joshua Ellison, male    DOB: 26-Nov-1960, 53 y.o.   MRN: [id number]  HPI Three-week history of sore throat. Symptoms are worse on the left side. He denies any cough. Only minimal postnasal drainage. He's had good appetite. No lymphadenopathy. No fever. He has been remarkable peopled mono recently but no close contacts. He denies any GERD symptoms. No voice changes.  Patient also requesting complete physical. He has a fatigue issues and requesting addition of testosterone level. Also some hair loss issues.  Past Medical History  Diagnosis Date  . SUPRAPUBIC PAIN 02/14/2009   Past Surgical History  Procedure Laterality Date  . Tonsillectomy  1970  . Vasectomy  2007  . Knee arthroscopy w/ acl reconstruction  1995    reports that he has quit smoking. He does not have any smokeless tobacco history on file. He reports that he does not drink alcohol or use illicit drugs. family history is not on file. No Known Allergies    Review of Systems  Constitutional: Positive for fatigue. Negative for fever and chills.  HENT: Positive for sore throat. Negative for trouble swallowing and voice change.   Respiratory: Negative for cough and shortness of breath.   Cardiovascular: Negative for chest pain.  Hematological: Negative for adenopathy.       Objective:   Physical Exam  Constitutional: He appears well-developed and well-nourished.  HENT:  Right Ear: External ear normal.  Left Ear: External ear normal.  Oropharynx reveals previous tonsillectomy. No exudate. No masses.  Neck:  No adenopathy noted  Cardiovascular: Normal rate.   Pulmonary/Chest: Effort normal and breath sounds normal. No respiratory distress. He has no wheezes. He has no rales.          Assessment & Plan:  Pharyngitis. Rapid strep negative. Nonfocal exam. He does not have any red flags such as appetite changes, fever, adenopathy. Observation. Consider ENT referral symptoms  persist  Schedule labs for complete physical

## 2013-11-29 NOTE — Progress Notes (Signed)
Pre visit review using our clinic review tool, if applicable. No additional management support is needed unless otherwise documented below in the visit note. 

## 2013-12-16 ENCOUNTER — Encounter: Payer: BC Managed Care – PPO | Admitting: Family Medicine

## 2013-12-23 ENCOUNTER — Encounter: Payer: Self-pay | Admitting: Family Medicine

## 2013-12-23 ENCOUNTER — Ambulatory Visit (INDEPENDENT_AMBULATORY_CARE_PROVIDER_SITE_OTHER): Payer: BC Managed Care – PPO | Admitting: Family Medicine

## 2013-12-23 ENCOUNTER — Encounter: Payer: BC Managed Care – PPO | Admitting: Family Medicine

## 2013-12-23 VITALS — BP 130/80 | HR 61 | Temp 97.4°F | Ht 69.0 in | Wt 225.0 lb

## 2013-12-23 DIAGNOSIS — Z0000 Encounter for general adult medical examination without abnormal findings: Secondary | ICD-10-CM

## 2013-12-23 DIAGNOSIS — E669 Obesity, unspecified: Secondary | ICD-10-CM | POA: Insufficient documentation

## 2013-12-23 LAB — LIPID PANEL
CHOLESTEROL: 224 mg/dL — AB (ref 0–200)
HDL: 64.7 mg/dL (ref >39.00)
Total CHOL/HDL Ratio: 3
Triglycerides: 130 mg/dL (ref 0.0–149.0)
VLDL: 26 mg/dL (ref 0.0–40.0)

## 2013-12-23 LAB — HEPATIC FUNCTION PANEL
ALBUMIN: 4.8 g/dL (ref 3.5–5.2)
ALK PHOS: 45 U/L (ref 39–117)
ALT: 21 U/L (ref 0–53)
AST: 14 U/L (ref 0–37)
Bilirubin, Direct: 0.1 mg/dL (ref 0.0–0.3)
TOTAL PROTEIN: 7.5 g/dL (ref 6.0–8.3)
Total Bilirubin: 0.8 mg/dL (ref 0.3–1.2)

## 2013-12-23 LAB — BASIC METABOLIC PANEL
BUN: 13 mg/dL (ref 6–23)
CALCIUM: 10.2 mg/dL (ref 8.4–10.5)
CO2: 27 mEq/L (ref 19–32)
CREATININE: 1 mg/dL (ref 0.4–1.5)
Chloride: 101 mEq/L (ref 96–112)
GFR: 84 mL/min (ref >60.00)
Glucose, Bld: 98 mg/dL (ref 70–99)
POTASSIUM: 4.2 meq/L (ref 3.5–5.1)
Sodium: 135 mEq/L (ref 135–145)

## 2013-12-23 LAB — POCT URINALYSIS DIPSTICK
Bilirubin, UA: NEGATIVE
Glucose, UA: NEGATIVE
KETONES UA: NEGATIVE
LEUKOCYTES UA: NEGATIVE
Nitrite, UA: NEGATIVE
PH UA: 5.5
PROTEIN UA: NEGATIVE
RBC UA: NEGATIVE
Spec Grav, UA: <=1.005
Urobilinogen, UA: 0.2

## 2013-12-23 LAB — TESTOSTERONE: TESTOSTERONE: 300.18 ng/dL — AB (ref 350.00–890.00)

## 2013-12-23 LAB — CBC WITH DIFFERENTIAL/PLATELET
BASOS ABS: 0 10*3/uL (ref 0.0–0.1)
Basophils Relative: 0.2 % (ref 0.0–3.0)
EOS ABS: 0.2 10*3/uL (ref 0.0–0.7)
Eosinophils Relative: 3.2 % (ref 0.0–5.0)
HEMATOCRIT: 45.8 % (ref 39.0–52.0)
HEMOGLOBIN: 15.3 g/dL (ref 13.0–17.0)
LYMPHS ABS: 2.2 10*3/uL (ref 0.7–4.0)
Lymphocytes Relative: 31.9 % (ref 12.0–46.0)
MCHC: 33.4 g/dL (ref 30.0–36.0)
MCV: 91.2 fl (ref 78.0–100.0)
Monocytes Absolute: 0.5 10*3/uL (ref 0.1–1.0)
Monocytes Relative: 7.4 % (ref 3.0–12.0)
NEUTROS ABS: 3.9 10*3/uL (ref 1.4–7.7)
Neutrophils Relative %: 57.3 % (ref 43.0–77.0)
Platelets: 261 10*3/uL (ref 150.0–400.0)
RBC: 5.03 Mil/uL (ref 4.22–5.81)
RDW: 12.7 % (ref 11.5–14.6)
WBC: 6.8 10*3/uL (ref 4.5–10.5)

## 2013-12-23 LAB — LDL CHOLESTEROL, DIRECT: Direct LDL: 131.2 mg/dL

## 2013-12-23 LAB — TSH: TSH: 3.25 u[IU]/mL (ref 0.35–5.50)

## 2013-12-23 LAB — PSA: PSA: 0.64 ng/mL (ref 0.10–4.00)

## 2013-12-23 NOTE — Progress Notes (Signed)
   Subjective:    Patient ID: Joshua Ellison, male    DOB: [birth year], 53 y.o.   MRN: [id number]  HPI Patient here for complete physical exam. He has no chronic medical problems. Tetanus up-to-date. He's never had screening colonoscopy but is willing to give this schedule. He had some mild weight gain over the past year. No consistent exercise. Nonsmoker. Family history significant for father with type 2 diabetes.  Past Medical History  Diagnosis Date  . SUPRAPUBIC PAIN 02/14/2009   Past Surgical History  Procedure Laterality Date  . Tonsillectomy  1970  . Vasectomy  2007  . Knee arthroscopy w/ acl reconstruction  1995    reports that he has quit smoking. He does not have any smokeless tobacco history on file. He reports that he does not drink alcohol or use illicit drugs. family history includes COPD in his paternal grandmother; Diabetes in his father; Hyperlipidemia in his mother. No Known Allergies    Review of Systems  Constitutional: Negative for fever, activity change, appetite change and fatigue.  HENT: Negative for congestion, ear pain and trouble swallowing.   Eyes: Negative for pain and visual disturbance.  Respiratory: Negative for cough, shortness of breath and wheezing.   Cardiovascular: Negative for chest pain and palpitations.  Gastrointestinal: Negative for nausea, vomiting, abdominal pain, diarrhea, constipation, blood in stool, abdominal distention and rectal pain.  Endocrine: Negative for polydipsia and polyuria.  Genitourinary: Negative for dysuria, hematuria and testicular pain.  Musculoskeletal: Negative for arthralgias and joint swelling.  Skin: Negative for rash.  Neurological: Negative for dizziness, syncope and headaches.  Hematological: Negative for adenopathy.  Psychiatric/Behavioral: Negative for confusion and dysphoric mood.       Objective:   Physical Exam  Constitutional: He is oriented to person, place, and time. He appears well-developed and  well-nourished. No distress.  HENT:  Head: Normocephalic and atraumatic.  Right Ear: External ear normal.  Left Ear: External ear normal.  Mouth/Throat: Oropharynx is clear and moist.  Eyes: Conjunctivae and EOM are normal. Pupils are equal, round, and reactive to light.  Neck: Normal range of motion. Neck supple. No thyromegaly present.  Cardiovascular: Normal rate, regular rhythm and normal heart sounds.   No murmur heard. Pulmonary/Chest: No respiratory distress. He has no wheezes. He has no rales.  Abdominal: Soft. Bowel sounds are normal. He exhibits no distension and no mass. There is no tenderness. There is no rebound and no guarding.  Genitourinary: Rectum normal and prostate normal.  Musculoskeletal: He exhibits no edema.  Lymphadenopathy:    He has no cervical adenopathy.  Neurological: He is alert and oriented to person, place, and time. He displays normal reflexes. No cranial nerve deficit.  Skin: No rash noted.  Psychiatric: He has a normal mood and affect.          Assessment & Plan:  Complete physical. Schedule colonoscopy. Screening labs obtained. Tetanus up-to-date. Discussed weight control and weight loss strategies

## 2013-12-23 NOTE — Progress Notes (Signed)
Pre visit review using our clinic review tool, if applicable. No additional management support is needed unless otherwise documented below in the visit note. 

## 2013-12-23 NOTE — Addendum Note (Signed)
Addended by: Townsend Roger D on: 12/23/2013 09:52 AM   Modules accepted: Orders

## 2014-01-19 ENCOUNTER — Other Ambulatory Visit: Payer: Self-pay

## 2014-01-19 DIAGNOSIS — R7989 Other specified abnormal findings of blood chemistry: Secondary | ICD-10-CM

## 2014-01-28 ENCOUNTER — Other Ambulatory Visit (INDEPENDENT_AMBULATORY_CARE_PROVIDER_SITE_OTHER): Payer: BC Managed Care – PPO

## 2014-01-28 DIAGNOSIS — R7989 Other specified abnormal findings of blood chemistry: Secondary | ICD-10-CM

## 2014-01-28 DIAGNOSIS — E291 Testicular hypofunction: Secondary | ICD-10-CM

## 2014-01-28 LAB — TESTOSTERONE: Testosterone: 178.64 ng/dL — ABNORMAL LOW (ref 350.00–890.00)

## 2014-02-09 ENCOUNTER — Encounter: Payer: Self-pay | Admitting: Family Medicine

## 2014-02-09 ENCOUNTER — Ambulatory Visit (INDEPENDENT_AMBULATORY_CARE_PROVIDER_SITE_OTHER): Payer: BC Managed Care – PPO | Admitting: Family Medicine

## 2014-02-09 VITALS — BP 120/84 | HR 64 | Temp 98.7°F | Wt 219.0 lb

## 2014-02-09 DIAGNOSIS — E291 Testicular hypofunction: Secondary | ICD-10-CM

## 2014-02-09 DIAGNOSIS — R7989 Other specified abnormal findings of blood chemistry: Secondary | ICD-10-CM | POA: Insufficient documentation

## 2014-02-09 MED ORDER — TESTOSTERONE 20.25 MG/ACT (1.62%) TD GEL: TRANSDERMAL | Status: DC

## 2014-02-09 NOTE — Progress Notes (Signed)
Pre visit review using our clinic review tool, if applicable. No additional management support is needed unless otherwise documented below in the visit note. 

## 2014-02-09 NOTE — Patient Instructions (Signed)
Testosterone skin gel What is this medicine? TESTOSTERONE (tes TOS ter one) is the main male hormone. It supports normal male traits such as muscle growth, facial hair, and deep voice. This gel is used in males to treat low testosterone levels. This medicine may be used for other purposes; ask your health care provider or pharmacist if you have questions. COMMON BRAND NAME(S): AndroGel, FORTESTA, Testim What should I tell my health care provider before I take this medicine? They need to know if you have any of these conditions: -breast cancer -diabetes -heart disease -if a male partner is pregnant or trying to get pregnant -kidney disease -liver disease -lung disease -prostate cancer, enlargement -an unusual or allergic reaction to testosterone, soy proteins, other medicines, foods, dyes, or preservatives -pregnant or trying to get pregnant -breast-feeding How should I use this medicine? This medicine is for external use only. This medicine is applied at the same time every day (preferably in the morning) to clean, dry, intact skin. If you take a bath or shower in the morning, apply the gel after the bath or shower. Follow the directions on the prescription label. Make sure that you are using your testosterone gel product correctly and applying it only to the appropriate skin area (see below). Allow the skin to dry a few minutes then cover with clothing to prevent others from coming in contact with the medicine on your skin. The gel is flammable. Avoid fire, flame, or smoking until the gel has dried. Wash your hands with soap and water after use. For AndroGel Packets: Open the packet(s) needed for your dose. You can put the entire dose into your palm all at once or just a little at a time to apply. If you prefer, you can instead squeeze the gel directly onto the area you are applying it to. Apply on the shoulders, upper arm, or abdomen as directed. Do not apply to the scrotum or genitals. Be  sure you use the correct total dose. It is best to wait 5 to 6 hours after application of the gel before showering or swimming. For AndroGel 1%: Pump the dose into the palm of your hand. You can put the entire dose into your palm all at once or just a little at a time to apply. If you prefer, you can instead pump the gel directly onto the area you are applying it to. Apply on the shoulders, upper arm, or abdomen as directed. Do not apply to the scrotum or genitals. Be sure you use the correct total dose. It is best to wait for 5 to 6 hours after application of the gel before showering or swimming. For AndroGel 1.62%: Pump the dose into the palm of your hand. Dispense one pump of gel at a time into the palm of your hand before applying it. If you prefer, you can instead pump the gel directly onto the area you are applying it to. Apply on the shoulders and upper arms as directed. Do not apply to other parts of the body including the abdomen or genitals. Be sure you use the correct total dose. It is best to wait 2 hours after application of the gel before washing, showering, or swimming. For Testim: Open the tube(s) needed for your dose. Squeeze the gel from the tube into the palm of your hand. Apply on the shoulders or upper arms as directed. Do not apply to the scrotum, genitals, or abdomen. Be sure you use the correct total dose. Do not shower  or swim for at least 2 hours after application of the gel. For Fortesta: Use the multi-dose pump to pump the gel directly onto the area you are applying it to. Apply on the thighs as directed. Do not apply to the abdomen, penis, scrotum, shoulders or upper arms. Gently rub the gel onto the skin using your finger. Be sure you use the correct total dose. Do not shower or swim for at least 2 hours after application of the gel. A special MedGuide will be given to you by the pharmacist with each prescription and refill. Be sure to read this information carefully each  time. Talk to your pediatrician regarding the use of this medicine in children. Special care may be needed. Overdosage: If you think you have taken too much of this medicine contact a poison control center or emergency room at once. NOTE: This medicine is only for you. Do not share this medicine with others. What if I miss a dose? If you miss a dose, use it as soon as you can. If it is almost time for your next dose, use only that dose. Do not use double or extra doses. What may interact with this medicine? -medicines for diabetes -medicines that treat or prevent blood clots like warfarin -oxyphenbutazone -propranolol -steroid medicines like prednisone or cortisone This list may not describe all possible interactions. Give your health care provider a list of all the medicines, herbs, non-prescription drugs, or dietary supplements you use. Also tell them if you smoke, drink alcohol, or use illegal drugs. Some items may interact with your medicine. What should I watch for while using this medicine? Visit your doctor or health care professional for regular checks on your progress. They will need to check the level of testosterone in your blood. This medicine can transfer from your body to others. If a person or pet comes in contact with the area where this medicine was applied to your skin, they may have a serious risk of side effects. If you cannot avoid skin-to-skin contact with another person, make sure the site where this medicine was applied is covered with clothing. If accidental contact happens, the skin of the person or pet should be washed right away with soap and water. Also, a male partner who is pregnant or trying to get pregnant should avoid contact with the gel or treated skin. This medicine may affect blood sugar levels. If you have diabetes, check with your doctor or health care professional before you change your diet or the dose of your diabetic medicine. This drug is banned from  use in athletes by most athletic organizations. What side effects may I notice from receiving this medicine? Side effects that you should report to your doctor or health care professional as soon as possible: -allergic reactions like skin rash, itching or hives, swelling of the face, lips, or tongue -breast enlargement -breathing problems -changes in mood, especially anger, depression, or rage -dark urine -general ill feeling or flu-like symptoms -light-colored stools -loss of appetite, nausea -nausea, vomiting -right upper belly pain -stomach pain -swelling of ankles -too frequent or persistent erections -trouble passing urine or change in the amount of urine -unusually weak or tired -yellowing of the eyes or skin Side effects that usually do not require medical attention (report to your doctor or health care professional if they continue or are bothersome): -acne -change in sex drive or performance -hair loss -headache This list may not describe all possible side effects. Call your doctor for medical   advice about side effects. You may report side effects to FDA at 1-800-FDA-1088. Where should I keep my medicine? Keep out of the reach of children. This medicine can be abused. Keep your medicine in a safe place to protect it from theft. Do not share this medicine with anyone. Selling or giving away this medicine is dangerous and against the law. Store at room temperature between 15 to 30 degrees C (59 to 86 degrees F). Keep closed until use. Protect from heat and light. This medicine is flammable. Avoid exposure to heat, fire, flame, and smoking. Throw away any unused medicine after the expiration date. NOTE: This sheet is a summary. It may not cover all possible information. If you have questions about this medicine, talk to your doctor, pharmacist, or health care provider.  2014, Elsevier/Gold Standard. (2010-03-05 16:45:50)  

## 2014-02-09 NOTE — Progress Notes (Signed)
   Subjective:    Patient ID: Joshua Ellison, male    DOB: 07-Jan-1961, 53 y.o.   MRN: [id number]  HPI Here to discuss low testosterone. Patient's had recent issues with decreased libido, fatigue, and occasional erectile dysfunction. Patient had recent physical and total testosterone level 300 with normal range for 350-890. Repeat level one week ago 178. He had recent labs including normal PSA and normal TSH. He is interested in looking at possible replacement options. He does not plan to have any additional children. No history of any prostate difficulties. No history of polycythemia.  Past Medical History  Diagnosis Date  . SUPRAPUBIC PAIN 02/14/2009   Past Surgical History  Procedure Laterality Date  . Tonsillectomy  1970  . Vasectomy  2007  . Knee arthroscopy w/ acl reconstruction  1995    reports that he has quit smoking. He does not have any smokeless tobacco history on file. He reports that he does not drink alcohol or use illicit drugs. family history includes COPD in his paternal grandmother; Diabetes in his father; Hyperlipidemia in his mother. No Known Allergies    Review of Systems  Constitutional: Positive for fatigue. Negative for appetite change and unexpected weight change.  Respiratory: Negative for shortness of breath.   Cardiovascular: Negative for chest pain.  Genitourinary: Negative for dysuria.       Objective:   Physical Exam  Constitutional: He appears well-developed and well-nourished. No distress.  Neck: Neck supple. No thyromegaly present.  Cardiovascular: Normal rate.  Exam reveals no gallop.   No murmur heard. Pulmonary/Chest: Effort normal and breath sounds normal. No respiratory distress. He has no wheezes. He has no rales.  Musculoskeletal: He exhibits no edema.          Assessment & Plan:  Hypogonadism. Patient has symptoms including low libido, fatigue and occasional erectile dysfunction. Confirmed low testosterone on 2 separate readings.  Recent PSA normal. We discussed options for replacement and he would like to try androgen gel 1.62%. Two pump sprays once daily. Recheck level of total testosterone in one month. If he remains on replacement, will need yearly PSA and CBC

## 2014-03-11 ENCOUNTER — Encounter: Payer: Self-pay | Admitting: Family Medicine

## 2014-03-18 ENCOUNTER — Other Ambulatory Visit: Payer: BC Managed Care – PPO

## 2014-03-21 ENCOUNTER — Other Ambulatory Visit (INDEPENDENT_AMBULATORY_CARE_PROVIDER_SITE_OTHER): Payer: BC Managed Care – PPO

## 2014-03-21 DIAGNOSIS — E291 Testicular hypofunction: Secondary | ICD-10-CM

## 2014-03-21 DIAGNOSIS — R7989 Other specified abnormal findings of blood chemistry: Secondary | ICD-10-CM

## 2014-03-21 LAB — TESTOSTERONE: Testosterone: 287.57 ng/dL — ABNORMAL LOW (ref 300.00–890.00)

## 2014-04-11 ENCOUNTER — Telehealth: Payer: Self-pay

## 2014-04-11 NOTE — Telephone Encounter (Signed)
Pt stated that he was doing 4 pumps to skin daily. 2 per arm. Now since lab results he is only suppose to do 3 pumps. Do you want him to continue with 4?

## 2014-04-12 NOTE — Telephone Encounter (Signed)
Continue with 4.  4 is the recommended maximum dose and if not achieving adequate levels with that we have to look at possible IM replacement vs continuing current dose.

## 2014-04-12 NOTE — Telephone Encounter (Signed)
Pt informed

## 2014-04-12 NOTE — Telephone Encounter (Signed)
Left message for patient to return call.

## 2014-05-17 ENCOUNTER — Encounter: Payer: Self-pay | Admitting: Gastroenterology

## 2014-06-03 ENCOUNTER — Telehealth: Payer: Self-pay | Admitting: Family Medicine

## 2014-06-03 MED ORDER — TESTOSTERONE 20.25 MG/ACT (1.62%) TD GEL: TRANSDERMAL | Status: DC

## 2014-06-03 NOTE — Telephone Encounter (Signed)
Pt is needing new rx Testosterone (ANDROGEL PUMP) 20.25 MG/ACT (1.62%) GEL. Pt is leaving to go out of town tomorrow, need to pick up rx today, [facility] [place].

## 2014-06-03 NOTE — Telephone Encounter (Signed)
Dr. Elease Hashimoto, pt requesting refill on Androgel pump. Last Testosterone was 03/2014 and you said repeat in 2 months in result note. Does pt need lab appointment?

## 2014-06-03 NOTE — Telephone Encounter (Signed)
Refill OK but would schedule repeat testosterone for later (after he gets back).

## 2014-06-03 NOTE — Telephone Encounter (Signed)
Spoke to pt's wife Freida Busman, told her okay to refill Rx but pt should schedule repeat Testosterone level. Candy said he already has appointment scheduled. Told her okay [facility]. Candy verbalized understanding. Rx faxed.

## 2014-06-07 ENCOUNTER — Telehealth: Payer: Self-pay | Admitting: Family Medicine

## 2014-06-07 NOTE — Telephone Encounter (Signed)
Called stated that they did receive the fax and patient has picked up RX.

## 2014-06-07 NOTE — Telephone Encounter (Signed)
[id number] - [place], [place] - [address] is requesting re-fill on Testosterone (ANDROGEL PUMP) 20.25 MG/ACT (1.62%) GEL. FYI: Last filled date has 04/16/14 on it. Butch Penny noted she faxed re-fill to pharmacy on 06/03/14.

## 2014-07-04 ENCOUNTER — Ambulatory Visit (AMBULATORY_SURGERY_CENTER): Payer: Self-pay | Admitting: *Deleted

## 2014-07-04 VITALS — Ht 70.0 in | Wt 217.2 lb

## 2014-07-04 DIAGNOSIS — Z1211 Encounter for screening for malignant neoplasm of colon: Secondary | ICD-10-CM

## 2014-07-04 MED ORDER — NA SULFATE-K SULFATE-MG SULF 17.5-3.13-1.6 GM/177ML PO SOLN: ORAL | Status: DC

## 2014-07-04 NOTE — Progress Notes (Signed)
No allergies to eggs or soy. No problems with anesthesia.  Pt given Emmi instructions for colonoscopy  No oxygen use  No diet drug use  

## 2014-07-07 ENCOUNTER — Encounter: Payer: Self-pay | Admitting: Gastroenterology

## 2014-07-14 ENCOUNTER — Telehealth: Payer: Self-pay | Admitting: Family Medicine

## 2014-07-14 ENCOUNTER — Other Ambulatory Visit: Payer: Self-pay | Admitting: Family Medicine

## 2014-07-14 DIAGNOSIS — R7989 Other specified abnormal findings of blood chemistry: Secondary | ICD-10-CM

## 2014-07-14 NOTE — Telephone Encounter (Signed)
Pt called to say that he was told after being on Testosterone (ANDROGEL PUMP) 20.25 MG/ACT (1.62%) GEL for a couple of months he need to come in and be tested. Need an order to schedule test .

## 2014-07-14 NOTE — Telephone Encounter (Signed)
Can you please call the patient  And set up lab appointment.

## 2014-07-15 ENCOUNTER — Telehealth: Payer: Self-pay | Admitting: Family Medicine

## 2014-07-15 ENCOUNTER — Other Ambulatory Visit: Payer: Self-pay | Admitting: Family Medicine

## 2014-07-15 ENCOUNTER — Other Ambulatory Visit (INDEPENDENT_AMBULATORY_CARE_PROVIDER_SITE_OTHER): Payer: BC Managed Care – PPO

## 2014-07-15 DIAGNOSIS — K469 Unspecified abdominal hernia without obstruction or gangrene: Secondary | ICD-10-CM

## 2014-07-15 DIAGNOSIS — E291 Testicular hypofunction: Secondary | ICD-10-CM

## 2014-07-15 DIAGNOSIS — R7989 Other specified abnormal findings of blood chemistry: Secondary | ICD-10-CM

## 2014-07-15 LAB — TESTOSTERONE: TESTOSTERONE: 154.24 ng/dL — AB (ref 300.00–890.00)

## 2014-07-15 NOTE — Telephone Encounter (Signed)
Refer to [facility] Surgery to Dr Lucia Gaskins or [place].

## 2014-07-15 NOTE — Telephone Encounter (Signed)
Referral is ordered

## 2014-07-15 NOTE — Telephone Encounter (Signed)
appt has been scheduled.

## 2014-07-15 NOTE — Telephone Encounter (Signed)
Pt would like referral to a surgeon for repair of his abdominal hernia. Pt not sure he needs formal referral , but pt doesn't know where ot go.

## 2014-07-18 ENCOUNTER — Telehealth: Payer: Self-pay

## 2014-07-18 ENCOUNTER — Encounter: Payer: BC Managed Care – PPO | Admitting: Gastroenterology

## 2014-07-18 NOTE — Telephone Encounter (Signed)
D/c androgel and start testosterone 200 mg IM once monthly and repeat testosterone level 14 days after injection to assess level.

## 2014-07-18 NOTE — Telephone Encounter (Signed)
Pt stated that he has already been doing the 4 pumps. Pt stated that he is fine with changing to injections. His wife is a nurse so that he can get the injections at home.

## 2014-07-19 MED ORDER — TESTOSTERONE CYPIONATE 200 MG/ML IM SOLN: 200.0000 mg | INTRAMUSCULAR | Status: DC

## 2014-07-19 MED ORDER — "SYRINGE/NEEDLE (DISP) 22G X 1-1/2"" 3 ML MISC": Status: DC

## 2014-07-19 NOTE — Telephone Encounter (Signed)
Pt is aware and RX sent to pharmacy. Lab is ordered.

## 2014-07-25 ENCOUNTER — Encounter: Payer: Self-pay | Admitting: Gastroenterology

## 2014-07-25 ENCOUNTER — Ambulatory Visit (AMBULATORY_SURGERY_CENTER): Payer: BC Managed Care – PPO | Admitting: Gastroenterology

## 2014-07-25 VITALS — BP 123/87 | HR 62 | Temp 98.3°F | Resp 12 | Ht 70.0 in | Wt 217.0 lb

## 2014-07-25 DIAGNOSIS — D126 Benign neoplasm of colon, unspecified: Secondary | ICD-10-CM

## 2014-07-25 DIAGNOSIS — Z1211 Encounter for screening for malignant neoplasm of colon: Secondary | ICD-10-CM

## 2014-07-25 DIAGNOSIS — D120 Benign neoplasm of cecum: Secondary | ICD-10-CM

## 2014-07-25 DIAGNOSIS — D122 Benign neoplasm of ascending colon: Secondary | ICD-10-CM

## 2014-07-25 DIAGNOSIS — K5730 Diverticulosis of large intestine without perforation or abscess without bleeding: Secondary | ICD-10-CM

## 2014-07-25 MED ORDER — SODIUM CHLORIDE 0.9 % IV SOLN: 500.0000 mL | INTRAVENOUS | Status: DC

## 2014-07-25 NOTE — Progress Notes (Signed)
Called to room to assist during endoscopic procedure.  Patient ID and intended procedure confirmed with present staff. Received instructions for my participation in the procedure from the performing physician.  

## 2014-07-25 NOTE — Op Note (Signed)
[place]  [address] [place], [id number]   COLONOSCOPY PROCEDURE REPORT  PATIENT: Joshua, Ellison  MR#: [id number] BIRTHDATE: 1961-09-04 , 89  yrs. old GENDER: male ENDOSCOPIST: Inda Castle, MD REFERRED LE:XNTZG Burchette, M.D. PROCEDURE DATE:  07/25/2014 PROCEDURE:   Colonoscopy with snare polypectomy and Colonoscopy with cold biopsy polypectomy First Screening Colonoscopy - Avg.  risk and is 50 yrs.  old or older Yes.  Prior Negative Screening - Now for repeat screening. N/A  History of Adenoma - Now for follow-up colonoscopy & has been > or = to 3 yrs.  N/A  Polyps Removed Today? Yes. ASA CLASS:   Class I INDICATIONS:average risk for colon cancer. MEDICATIONS: Monitored anesthesia care and Propofol (Diprivan) 440 mg IV  DESCRIPTION OF PROCEDURE:   After the risks benefits and alternatives of the procedure were thoroughly explained, informed consent was obtained. Digital exam  revealed no abnormalities of the rectum.   The LB [id number]  endoscope was introduced through the anus and advanced to the cecum, which was identified by both the appendix and ileocecal valve. No adverse events experienced.   The quality of the prep was Suprep good  The instrument was then slowly withdrawn as the colon was fully examined.      COLON FINDINGS: A sessile polyp was found in the sigmoid colon. There was mild diverticulosis noted in the sigmoid colon. Retroflexed views revealed no abnormalities. The time to cecum=4 minutes 50 seconds.  Withdrawal time=13 minutes 35 seconds.  The scope was withdrawn and the procedure completed. COMPLICATIONS: There were no complications.  ENDOSCOPIC IMPRESSION: 1.   Sessile polyp measuring 2 mm in size was found at the cecum; polypectomy was performed with a cold snare 2.   Mild diverticulosis was noted in the ascending colon 3.   Sessile polyp measuring 12 mm in size was found in the ascending colon; polypectomy was  performed with a cold snare 4.   Sessile polyp measuring 3 mm in size was found at the ascending colon; polypectomy was performed with cold forceps   RECOMMENDATIONS: If the polyp(s) removed today are proven to be adenomatous (pre-cancerous) polyps, you will need a colonoscopy in 3 years. Otherwise you should continue to follow colorectal cancer screening guidelines for "routine risk" patients with a colonoscopy in 10 years.  You will receive a letter within 1-2 weeks with the results of your biopsy as well as final recommendations.  Please call my office if you have not received a letter after 3 weeks.  eSigned:  Inda Castle, MD 07/25/2014 11:20 AM   cc:   PATIENT NAME:  Joshua Ellison, Joshua Ellison MR#: [id number]

## 2014-07-25 NOTE — Patient Instructions (Signed)
YOU HAD AN ENDOSCOPIC PROCEDURE TODAY AT THE [facility]: Refer to the procedure report that was given to you for any specific questions about what was found during the examination.  If the procedure report does not answer your questions, please call your gastroenterologist to clarify.  If you requested that your care partner not be given the details of your procedure findings, then the procedure report has been included in a sealed envelope for you to review at your convenience later.  YOU SHOULD EXPECT: Some feelings of bloating in the abdomen. Passage of more gas than usual.  Walking can help get rid of the air that was put into your GI tract during the procedure and reduce the bloating. If you had a lower endoscopy (such as a colonoscopy or flexible sigmoidoscopy) you may notice spotting of blood in your stool or on the toilet paper. If you underwent a bowel prep for your procedure, then you may not have a normal bowel movement for a few days.  DIET: Your first meal following the procedure should be a light meal and then it is ok to progress to your normal diet.  A half-sandwich or bowl of soup is an example of a good first meal.  Heavy or fried foods are harder to digest and may make you feel nauseous or bloated.  Likewise meals heavy in dairy and vegetables can cause extra gas to form and this can also increase the bloating.  Drink plenty of fluids but you should avoid alcoholic beverages for 24 hours.  ACTIVITY: Your care partner should take you home directly after the procedure.  You should plan to take it easy, moving slowly for the rest of the day.  You can resume normal activity the day after the procedure however you should NOT DRIVE or use heavy machinery for 24 hours (because of the sedation medicines used during the test).    SYMPTOMS TO REPORT IMMEDIATELY: A gastroenterologist can be reached at any hour.  During normal business hours, 8:30 AM to 5:00 PM Monday through Friday,  call [phone].  After hours and on weekends, please call the GI answering service at [phone] who will take a message and have the physician on call contact you.   Following lower endoscopy (colonoscopy or flexible sigmoidoscopy):  Excessive amounts of blood in the stool  Significant tenderness or worsening of abdominal pains  Swelling of the abdomen that is new, acute  Fever of 100F or higher   FOLLOW UP: If any biopsies were taken you will be contacted by phone or by letter within the next 1-3 weeks.  Call your gastroenterologist if you have not heard about the biopsies in 3 weeks.  Our staff will call the home number listed on your records the next business day following your procedure to check on you and address any questions or concerns that you may have at that time regarding the information given to you following your procedure. This is a courtesy call and so if there is no answer at the home number and we have not heard from you through the emergency physician on call, we will assume that you have returned to your regular daily activities without incident.  SIGNATURES/CONFIDENTIALITY: You and/or your care partner have signed paperwork which will be entered into your electronic medical record.  These signatures attest to the fact that that the information above on your After Visit Summary has been reviewed and is understood.  Full responsibility of the confidentiality of   this discharge information lies with you and/or your care-partner.   Resume medications. Information given on polyps,diverticulosis and high fiber diet with discharge instructions. 

## 2014-07-25 NOTE — Progress Notes (Signed)
Procedure ends, to recovery, report given and VSS. 

## 2014-07-26 ENCOUNTER — Telehealth: Payer: Self-pay

## 2014-07-26 ENCOUNTER — Encounter: Payer: Self-pay | Admitting: Family Medicine

## 2014-07-26 NOTE — Telephone Encounter (Signed)
  Follow up Call-  Call back number 07/25/2014  Post procedure Call Back phone  # [phone]  Permission to leave phone message Yes     Patient questions:  Do you have a fever, pain , or abdominal swelling? No. Pain Score  0 *  Have you tolerated food without any problems? Yes.    Have you been able to return to your normal activities? Yes.    Do you have any questions about your discharge instructions: Diet   No. Medications  No. Follow up visit  No.  Do you have questions or concerns about your Care? No.  Actions: * If pain score is 4 or above: No action needed, pain <4.  No problems per the pt's wife.  Pt had gone to work. [place]aw

## 2014-07-28 ENCOUNTER — Encounter: Payer: Self-pay | Admitting: Family Medicine

## 2014-07-29 ENCOUNTER — Encounter: Payer: Self-pay | Admitting: Gastroenterology

## 2014-08-08 ENCOUNTER — Other Ambulatory Visit (INDEPENDENT_AMBULATORY_CARE_PROVIDER_SITE_OTHER): Payer: BC Managed Care – PPO

## 2014-08-08 ENCOUNTER — Other Ambulatory Visit: Payer: Self-pay | Admitting: Family Medicine

## 2014-08-08 DIAGNOSIS — R7989 Other specified abnormal findings of blood chemistry: Secondary | ICD-10-CM

## 2014-08-08 DIAGNOSIS — E291 Testicular hypofunction: Secondary | ICD-10-CM

## 2014-08-08 LAB — TESTOSTERONE: Testosterone: 137.79 ng/dL — ABNORMAL LOW (ref 300.00–890.00)

## 2014-09-05 ENCOUNTER — Other Ambulatory Visit (INDEPENDENT_AMBULATORY_CARE_PROVIDER_SITE_OTHER): Payer: Self-pay | Admitting: Surgery

## 2014-10-10 ENCOUNTER — Encounter (HOSPITAL_COMMUNITY): Payer: Self-pay | Admitting: Pharmacy Technician

## 2014-10-11 ENCOUNTER — Encounter (HOSPITAL_COMMUNITY)
Admission: RE | Admit: 2014-10-11 | Discharge: 2014-10-11 | Disposition: A | Discharge status: Home / Self Care | Payer: BC Managed Care – PPO | Source: Ambulatory Visit | Attending: Surgery | Admitting: Surgery

## 2014-10-11 ENCOUNTER — Encounter (HOSPITAL_COMMUNITY): Payer: Self-pay

## 2014-10-11 ENCOUNTER — Other Ambulatory Visit (HOSPITAL_COMMUNITY): Payer: BC Managed Care – PPO

## 2014-10-11 DIAGNOSIS — Z01812 Encounter for preprocedural laboratory examination: Secondary | ICD-10-CM | POA: Diagnosis not present

## 2014-10-11 LAB — BASIC METABOLIC PANEL
Anion gap: 13 (ref 5–15)
BUN: 13 mg/dL (ref 6–23)
CHLORIDE: 102 meq/L (ref 96–112)
CO2: 26 mEq/L (ref 19–32)
CREATININE: 1.03 mg/dL (ref 0.50–1.35)
Calcium: 9.8 mg/dL (ref 8.4–10.5)
GFR calc non Af Amer: 81 mL/min — ABNORMAL LOW (ref >90)
GLUCOSE: 98 mg/dL (ref 70–99)
Potassium: 4.6 mEq/L (ref 3.7–5.3)
Sodium: 141 mEq/L (ref 137–147)

## 2014-10-11 LAB — CBC
HCT: 42.6 % (ref 39.0–52.0)
HEMOGLOBIN: 14.6 g/dL (ref 13.0–17.0)
MCH: 30.4 pg (ref 26.0–34.0)
MCHC: 34.3 g/dL (ref 30.0–36.0)
MCV: 88.8 fL (ref 78.0–100.0)
Platelets: 247 10*3/uL (ref 150–400)
RBC: 4.8 MIL/uL (ref 4.22–5.81)
RDW: 12.7 % (ref 11.5–15.5)
WBC: 6.9 10*3/uL (ref 4.0–10.5)

## 2014-10-11 NOTE — Pre-Procedure Instructions (Signed)
Joshua Ellison  10/11/2014   Your procedure is scheduled on:  10/20/2014  Report to [facility] Admitting   ENTRANCE A at 5:30 AM.  Call this number if you have problems the morning of surgery: [phone]   Remember:   Do not eat food or drink liquids after midnight. ON WEDNESDAY  Take these medicines the morning of surgery with A SIP OF WATER: NONE   Do not wear jewelry   Do not wear lotions, powders, or perfumes. You may wear deodorant.    Men may shave face and neck.   Do not bring valuables to the hospital.  [facility] is not responsible  for any belongings or valuables.               Contacts, dentures or bridgework may not be worn into surgery.   Leave suitcase in the car. After surgery it may be brought to your room     For patients admitted to the hospital, discharge time is determined by your                treatment team.               Patients discharged the day of surgery will not be allowed to drive  home.  Name and phone number of your driver: /w family  Special Instructions: Special Instructions: [place] - Preparing for Surgery  Before surgery, you can play an important role.  Because skin is not sterile, your skin needs to be as free of germs as possible.  You can reduce the number of germs on you skin by washing with CHG (chlorahexidine gluconate) soap before surgery.  CHG is an antiseptic cleaner which kills germs and bonds with the skin to continue killing germs even after washing.  Please DO NOT use if you have an allergy to CHG or antibacterial soaps.  If your skin becomes reddened/irritated stop using the CHG and inform your nurse when you arrive at Short Stay.  Do not shave (including legs and underarms) for at least 48 hours prior to the first CHG shower.  You may shave your face.  Please follow these instructions carefully:   1.  Shower with CHG Soap the night before surgery and the  morning of Surgery.  2.  If you choose to wash your  hair, wash your hair first as usual with your  normal shampoo.  3.  After you shampoo, rinse your hair and body thoroughly to remove the  Shampoo.  4.  Use CHG as you would any other liquid soap.  You can apply chg directly to the skin and wash gently with scrungie or a clean washcloth.  5.  Apply the CHG Soap to your body ONLY FROM THE NECK DOWN.    Do not use on open wounds or open sores.  Avoid contact with your eyes, ears, mouth and genitals (private parts).  Wash genitals (private parts)   with your normal soap.  6.  Wash thoroughly, paying special attention to the area where your surgery will be performed.  7.  Thoroughly rinse your body with warm water from the neck down.  8.  DO NOT shower/wash with your normal soap after using and rinsing off   the CHG Soap.  9.  Pat yourself dry with a clean towel.            10.  Wear clean pajamas.  11.  Place clean sheets on your bed the night of your first shower and do not sleep with pets.  Day of Surgery  Do not apply any lotions/deodorants the morning of surgery.  Please wear clean clothes to the hospital/surgery center.    Please read over the following fact sheets that you were given: Pain Booklet, Coughing and Deep Breathing and Surgical Site Infection Prevention

## 2014-10-19 MED ORDER — CEFAZOLIN SODIUM-DEXTROSE 2-3 GM-% IV SOLR
2.0000 g | INTRAVENOUS | Status: AC
  Administered 2014-10-20: 2 g via INTRAVENOUS
  Filled 2014-10-19: qty 50

## 2014-10-19 NOTE — H&P (Signed)
Joshua Ellison  Location: [facility] Patient #: [id number] DOB: [birth year] Married / Language: English / Race: White Male  History of Present Illness ( Patient words: TO ASSESS HERNIA MIDLINE ABOVE UMBILICUS.  The patient is a 53 year old male who presents with an umbilical hernia. This gentleman is a self-referral with a hernia above his umbilicus. He has had it for many years. He wears a binder for support. He has moderate pain at the hernia and no obstructive symptoms. He is otherwise without complaints.   Other Problems Ventral Hernia Repair  Past Surgical History  Colon Polyp Removal - Colonoscopy Knee Surgery Right. Oral Surgery Tonsillectomy Vasectomy  Diagnostic Studies History Colonoscopy within last year  Allergies  No Known Drug Allergies11/12/2013  Medication History  Testosterone Cypionate (100MG /ML Solution, Intramuscular) Active. Multivitamin Gummies Adult (Oral) Active. Advil (200MG  Capsule, Oral) Active.  Social History Alcohol use Moderate alcohol use. Caffeine use Coffee, Tea. No drug use Tobacco use Former smoker.  Family History  Alcohol Abuse Sister. Cerebrovascular Accident Family Members In General. Cervical Cancer Family Members In General. Depression Mother. Diabetes Mellitus Father. Heart Disease Father. Kidney Disease Family Members In General. Melanoma Father. Ovarian Cancer Family Members In General. Prostate Cancer Family Members In General. Respiratory Condition Family Members In General. Seizure disorder Family Members In General. Thyroid problems Family Members In General.  Review of Systems  General Not Present- Appetite Loss, Chills, Fatigue, Fever, Night Sweats, Weight Gain and Weight Loss. Skin Not Present- Change in Wart/Mole, Dryness, Hives, Jaundice, New Lesions, Non-Healing Wounds, Rash and Ulcer. HEENT Present- Wears glasses/contact lenses. Not Present- Earache, Hearing Loss,  Hoarseness, Nose Bleed, Oral Ulcers, Ringing in the Ears, Seasonal Allergies, Sinus Pain, Sore Throat, Visual Disturbances and Yellow Eyes. Respiratory Not Present- Bloody sputum, Chronic Cough, Difficulty Breathing, Snoring and Wheezing. Breast Not Present- Breast Mass, Breast Pain, Nipple Discharge and Skin Changes. Cardiovascular Not Present- Chest Pain, Difficulty Breathing Lying Down, Leg Cramps, Palpitations, Rapid Heart Rate, Shortness of Breath and Swelling of Extremities. Gastrointestinal Not Present- Abdominal Pain, Bloating, Bloody Stool, Change in Bowel Habits, Chronic diarrhea, Constipation, Difficulty Swallowing, Excessive gas, Gets full quickly at meals, Hemorrhoids, Indigestion, Nausea, Rectal Pain and Vomiting. Male Genitourinary Not Present- Blood in Urine, Change in Urinary Stream, Frequency, Impotence, Nocturia, Painful Urination, Urgency and Urine Leakage. Musculoskeletal Not Present- Back Pain, Joint Pain, Joint Stiffness, Muscle Pain, Muscle Weakness and Swelling of Extremities. Neurological Not Present- Decreased Memory, Fainting, Headaches, Numbness, Seizures, Tingling, Tremor, Trouble walking and Weakness. Psychiatric Not Present- Anxiety, Bipolar, Change in Sleep Pattern, Depression, Fearful and Frequent crying. Endocrine Not Present- Cold Intolerance, Excessive Hunger, Hair Changes, Heat Intolerance, Hot flashes and New Diabetes. Hematology Not Present- Easy Bruising, Excessive bleeding, Gland problems, HIV and Persistent Infections.   Vitals (  Weight: 213.38 lb Height: 70in Body Surface Area: 2.19 m Body Mass Index: 30.62 kg/m Temp.: 98.56F(Temporal)  Pulse: 60 (Regular)  Resp.: 18 (Unlabored)  BP: 128/92 (Sitting, Left Arm, Standard)    Physical Exam  General Mental Status-Alert. General Appearance-Consistent with stated age. Hydration-Well hydrated. Voice-Normal.  Head and Neck Head-normocephalic, atraumatic with no lesions or  palpable masses. Trachea-midline.  Eye Eyeball - Bilateral-Extraocular movements intact. Sclera/Conjunctiva - Bilateral-No scleral icterus.  Chest and Lung Exam Chest and lung exam reveals -quiet, even and easy respiratory effort with no use of accessory muscles and on auscultation, normal breath sounds, no adventitious sounds and normal vocal resonance. Inspection Chest Wall - Normal. Back - normal.  Cardiovascular Cardiovascular examination reveals -normal  heart sounds, regular rate and rhythm with no murmurs and normal pedal pulses bilaterally.  Abdomen Inspection Skin - Scar - no surgical scars. Hernias - Ventral - Reducible. Note: There is rectus diastases as well as a reducible hernia just above the umbilicus which is moderate in size. Palpation/Percussion Palpation and Percussion of the abdomen reveal - Soft, Non Tender, No Rebound tenderness, No Rigidity (guarding) and No hepatosplenomegaly. Auscultation Auscultation of the abdomen reveals - Bowel sounds normal.  Neurologic Neurologic evaluation reveals -alert and oriented x 3 with no impairment of recent or remote memory. Mental Status-Normal.  Musculoskeletal Normal Exam - Left-Upper Extremity Strength Normal and Lower Extremity Strength Normal. Normal Exam - Right-Upper Extremity Strength Normal, Lower Extremity Weakness.    Assessment & Plan   UMBILICAL HERNIA (789.7  K42.9)  Impression: I discussed the diagnosis with him in detail. I discussed hernia repair with mesh. I discussed both the laparoscopic and open techniques. He wished to proceed with an open repair with mesh. I discussed the risks which includes but is not limited to bleeding, infection, injury to surrounding structures, recurrence, etc. I also discussed postoperative recovery. He agrees to proceed.

## 2014-10-20 ENCOUNTER — Ambulatory Visit (HOSPITAL_COMMUNITY)
Admission: RE | Admit: 2014-10-20 | Discharge: 2014-10-20 | Disposition: A | Discharge status: Home / Self Care | Payer: BC Managed Care – PPO | Source: Ambulatory Visit | Attending: Surgery | Admitting: Surgery

## 2014-10-20 ENCOUNTER — Encounter (HOSPITAL_COMMUNITY)
Admission: RE | Disposition: A | Discharge status: Home / Self Care | Payer: Self-pay | Source: Ambulatory Visit | Attending: Surgery

## 2014-10-20 ENCOUNTER — Ambulatory Visit (HOSPITAL_COMMUNITY): Payer: BC Managed Care – PPO | Admitting: Certified Registered"

## 2014-10-20 ENCOUNTER — Encounter (HOSPITAL_COMMUNITY): Payer: Self-pay | Admitting: *Deleted

## 2014-10-20 DIAGNOSIS — K429 Umbilical hernia without obstruction or gangrene: Secondary | ICD-10-CM | POA: Diagnosis not present

## 2014-10-20 DIAGNOSIS — Z87891 Personal history of nicotine dependence: Secondary | ICD-10-CM | POA: Diagnosis not present

## 2014-10-20 HISTORY — PX: INSERTION OF MESH: SHX5868

## 2014-10-20 HISTORY — PX: UMBILICAL HERNIA REPAIR: SHX196

## 2014-10-20 SURGERY — REPAIR, HERNIA, UMBILICAL, ADULT
Anesthesia: General | Site: Abdomen

## 2014-10-20 MED ORDER — KETOROLAC TROMETHAMINE 30 MG/ML IJ SOLN
INTRAMUSCULAR | Status: AC
  Filled 2014-10-20: qty 1

## 2014-10-20 MED ORDER — OXYCODONE HCL 5 MG PO TABS
ORAL_TABLET | ORAL | Status: AC
  Filled 2014-10-20: qty 1

## 2014-10-20 MED ORDER — KETOROLAC TROMETHAMINE 30 MG/ML IJ SOLN
INTRAMUSCULAR | Status: DC | PRN
  Administered 2014-10-20: 30 mg via INTRAVENOUS

## 2014-10-20 MED ORDER — 0.9 % SODIUM CHLORIDE (POUR BTL) OPTIME
TOPICAL | Status: DC | PRN
  Administered 2014-10-20: 1000 mL

## 2014-10-20 MED ORDER — PROPOFOL 10 MG/ML IV BOLUS
INTRAVENOUS | Status: DC | PRN
  Administered 2014-10-20: 200 mg via INTRAVENOUS

## 2014-10-20 MED ORDER — FENTANYL CITRATE 0.05 MG/ML IJ SOLN
INTRAMUSCULAR | Status: DC | PRN
  Administered 2014-10-20: 100 ug via INTRAVENOUS

## 2014-10-20 MED ORDER — PROMETHAZINE HCL 25 MG/ML IJ SOLN: 6.2500 mg | INTRAMUSCULAR | Status: DC | PRN

## 2014-10-20 MED ORDER — MIDAZOLAM HCL 5 MG/5ML IJ SOLN
INTRAMUSCULAR | Status: DC | PRN
  Administered 2014-10-20: 2 mg via INTRAVENOUS

## 2014-10-20 MED ORDER — ONDANSETRON HCL 4 MG/2ML IJ SOLN
INTRAMUSCULAR | Status: AC
  Filled 2014-10-20: qty 2

## 2014-10-20 MED ORDER — LIDOCAINE HCL (CARDIAC) 20 MG/ML IV SOLN
INTRAVENOUS | Status: DC | PRN
  Administered 2014-10-20: 75 mg via INTRAVENOUS

## 2014-10-20 MED ORDER — DEXAMETHASONE SODIUM PHOSPHATE 4 MG/ML IJ SOLN
INTRAMUSCULAR | Status: DC | PRN
  Administered 2014-10-20: 4 mg via INTRAVENOUS

## 2014-10-20 MED ORDER — ONDANSETRON HCL 4 MG/2ML IJ SOLN
INTRAMUSCULAR | Status: DC | PRN
  Administered 2014-10-20: 4 mg via INTRAVENOUS

## 2014-10-20 MED ORDER — FENTANYL CITRATE 0.05 MG/ML IJ SOLN
INTRAMUSCULAR | Status: AC
  Filled 2014-10-20: qty 5

## 2014-10-20 MED ORDER — MORPHINE SULFATE 2 MG/ML IJ SOLN
INTRAMUSCULAR | Status: AC
  Filled 2014-10-20: qty 1

## 2014-10-20 MED ORDER — DEXAMETHASONE SODIUM PHOSPHATE 4 MG/ML IJ SOLN
INTRAMUSCULAR | Status: AC
  Filled 2014-10-20: qty 1

## 2014-10-20 MED ORDER — MIDAZOLAM HCL 2 MG/2ML IJ SOLN
INTRAMUSCULAR | Status: AC
  Filled 2014-10-20: qty 2

## 2014-10-20 MED ORDER — OXYCODONE HCL 5 MG PO TABS
5.0000 mg | ORAL_TABLET | ORAL | Status: DC | PRN
  Administered 2014-10-20 (×2): 5 mg via ORAL
  Filled 2014-10-20: qty 2

## 2014-10-20 MED ORDER — ROCURONIUM BROMIDE 50 MG/5ML IV SOLN
INTRAVENOUS | Status: AC
Start: 2014-10-20 — End: 2014-10-20
  Filled 2014-10-20: qty 1

## 2014-10-20 MED ORDER — MORPHINE SULFATE 2 MG/ML IJ SOLN
1.0000 mg | INTRAMUSCULAR | Status: DC | PRN
  Administered 2014-10-20: 2 mg via INTRAVENOUS

## 2014-10-20 MED ORDER — BUPIVACAINE HCL (PF) 0.25 % IJ SOLN
INTRAMUSCULAR | Status: AC
Start: 2014-10-20 — End: 2014-10-20
  Filled 2014-10-20: qty 30

## 2014-10-20 MED ORDER — LIDOCAINE HCL (CARDIAC) 20 MG/ML IV SOLN
INTRAVENOUS | Status: AC
  Filled 2014-10-20: qty 5

## 2014-10-20 MED ORDER — BUPIVACAINE HCL (PF) 0.25 % IJ SOLN
INTRAMUSCULAR | Status: DC | PRN
  Administered 2014-10-20: 30 mL

## 2014-10-20 MED ORDER — PROPOFOL 10 MG/ML IV BOLUS
INTRAVENOUS | Status: AC
  Filled 2014-10-20: qty 20

## 2014-10-20 MED ORDER — LACTATED RINGERS IV SOLN
INTRAVENOUS | Status: DC | PRN
  Administered 2014-10-20: 07:00:00 via INTRAVENOUS

## 2014-10-20 MED ORDER — HYDROCODONE-ACETAMINOPHEN 5-325 MG PO TABS: 1.0000 | ORAL_TABLET | ORAL | Status: DC | PRN

## 2014-10-20 MED ORDER — SUCCINYLCHOLINE CHLORIDE 20 MG/ML IJ SOLN
INTRAMUSCULAR | Status: AC
  Filled 2014-10-20: qty 1

## 2014-10-20 SURGICAL SUPPLY — 41 items
BENZOIN TINCTURE PRP APPL 2/3 (GAUZE/BANDAGES/DRESSINGS) ×3 IMPLANT
BLADE SURG 10 STRL SS (BLADE) ×3 IMPLANT
BLADE SURG 15 STRL LF DISP TIS (BLADE) ×1 IMPLANT
BLADE SURG 15 STRL SS (BLADE) ×2
BLADE SURG ROTATE 9660 (MISCELLANEOUS) ×3 IMPLANT
CHLORAPREP W/TINT 26ML (MISCELLANEOUS) ×3 IMPLANT
CLOSURE STERI-STRIP 1/2X4 (GAUZE/BANDAGES/DRESSINGS) ×1
CLOSURE WOUND 1/2 X4 (GAUZE/BANDAGES/DRESSINGS) ×1
CLSR STERI-STRIP ANTIMIC 1/2X4 (GAUZE/BANDAGES/DRESSINGS) ×2 IMPLANT
COVER SURGICAL LIGHT HANDLE (MISCELLANEOUS) ×3 IMPLANT
DRAPE PED LAPAROTOMY (DRAPES) ×3 IMPLANT
DRSG TEGADERM 4X4.75 (GAUZE/BANDAGES/DRESSINGS) ×3 IMPLANT
ELECT CAUTERY BLADE 6.4 (BLADE) ×3 IMPLANT
ELECT REM PT RETURN 9FT ADLT (ELECTROSURGICAL) ×3
ELECTRODE REM PT RTRN 9FT ADLT (ELECTROSURGICAL) ×1 IMPLANT
GAUZE SPONGE 2X2 8PLY STRL LF (GAUZE/BANDAGES/DRESSINGS) ×1 IMPLANT
GLOVE BIO SURGEON STRL SZ7 (GLOVE) ×3 IMPLANT
GLOVE BIOGEL PI IND STRL 7.0 (GLOVE) ×1 IMPLANT
GLOVE BIOGEL PI INDICATOR 7.0 (GLOVE) ×2
GLOVE SURG SIGNA 7.5 PF LTX (GLOVE) ×3 IMPLANT
GOWN STRL REUS W/ TWL LRG LVL3 (GOWN DISPOSABLE) ×1 IMPLANT
GOWN STRL REUS W/ TWL XL LVL3 (GOWN DISPOSABLE) ×1 IMPLANT
GOWN STRL REUS W/TWL LRG LVL3 (GOWN DISPOSABLE) ×2
GOWN STRL REUS W/TWL XL LVL3 (GOWN DISPOSABLE) ×2
KIT BASIN OR (CUSTOM PROCEDURE TRAY) ×3 IMPLANT
KIT ROOM TURNOVER OR (KITS) ×3 IMPLANT
MESH VENTRALEX ST 1-7/10 CRC S (Mesh General) ×3 IMPLANT
NEEDLE HYPO 25GX1X1/2 BEV (NEEDLE) ×3 IMPLANT
NS IRRIG 1000ML POUR BTL (IV SOLUTION) ×3 IMPLANT
PACK SURGICAL SETUP 50X90 (CUSTOM PROCEDURE TRAY) ×3 IMPLANT
PAD ARMBOARD 7.5X6 YLW CONV (MISCELLANEOUS) ×3 IMPLANT
PENCIL BUTTON HOLSTER BLD 10FT (ELECTRODE) ×3 IMPLANT
SPONGE GAUZE 2X2 STER 10/PKG (GAUZE/BANDAGES/DRESSINGS) ×2
SPONGE LAP 18X18 X RAY DECT (DISPOSABLE) ×3 IMPLANT
STRIP CLOSURE SKIN 1/2X4 (GAUZE/BANDAGES/DRESSINGS) ×2 IMPLANT
SUT MNCRL AB 4-0 PS2 18 (SUTURE) ×3 IMPLANT
SUT NOVA NAB DX-16 0-1 5-0 T12 (SUTURE) ×3 IMPLANT
SUT VIC AB 3-0 SH 27 (SUTURE) ×2
SUT VIC AB 3-0 SH 27X BRD (SUTURE) ×1 IMPLANT
SYR CONTROL 10ML LL (SYRINGE) ×3 IMPLANT
TOWEL OR 17X24 6PK STRL BLUE (TOWEL DISPOSABLE) ×3 IMPLANT

## 2014-10-20 NOTE — Anesthesia Preprocedure Evaluation (Addendum)
Anesthesia Evaluation  Patient identified by MRN, date of birth, ID band Patient awake    Reviewed: Allergy & Precautions, H&P , NPO status , Patient's Chart, lab work & pertinent test results  Airway Mallampati: I  TM Distance: >3 FB Neck ROM: Full    Dental  (+) Teeth Intact   Pulmonary former smoker,  breath sounds clear to auscultation        Cardiovascular negative cardio ROS  Rhythm:Regular Rate:Normal     Neuro/Psych    GI/Hepatic negative GI ROS, Neg liver ROS,   Endo/Other  negative endocrine ROS  Renal/GU negative Renal ROS     Musculoskeletal   Abdominal   Peds  Hematology negative hematology ROS (+)   Anesthesia Other Findings   Reproductive/Obstetrics                           Anesthesia Physical Anesthesia Plan  ASA: II  Anesthesia Plan: General   Post-op Pain Management:    Induction: Intravenous  Airway Management Planned: LMA and Oral ETT  Additional Equipment:   Intra-op Plan:   Post-operative Plan: Extubation in OR  Informed Consent: I have reviewed the patients History and Physical, chart, labs and discussed the procedure including the risks, benefits and alternatives for the proposed anesthesia with the patient or authorized representative who has indicated his/her understanding and acceptance.   Dental advisory given  Plan Discussed with: CRNA, Surgeon and Anesthesiologist  Anesthesia Plan Comments:        Anesthesia Quick Evaluation

## 2014-10-20 NOTE — Op Note (Signed)
NAME:  Joshua Ellison, Joshua Ellison                ACCOUNT NO.:  [id number]  MEDICAL RECORD NO.:  [id number]  LOCATION:  [facility]                         FACILITY:  [facility]  PHYSICIAN:  Coralie Keens, M.D. DATE OF BIRTH:  December 24, 1960  DATE OF PROCEDURE:  10/20/2014 DATE OF DISCHARGE:                              OPERATIVE REPORT   PREOPERATIVE DIAGNOSIS:  Umbilical hernia.  POSTOPERATIVE DIAGNOSIS:  Umbilical hernia.  PROCEDURE:  Umbilical hernia repair with mesh.  SURGEON:  Coralie Keens, MD  ANESTHESIA:  General and 0.25% Marcaine.  ESTIMATED BLOOD LOSS:  Minimal.  FINDINGS:  The patient was found to have a hernia several inches above the umbilicus.  It contained only omentum.  It was repaired with a 4.3- cm round Proceed Prolene patch.  PROCEDURE IN DETAIL:  The patient was brought to the operating room, identified as [name].  He was placed supine on the operating room table and general anesthesia was induced.  His abdomen was then prepped and draped in usual sterile fashion.  I anesthetized the skin above the umbilicus with Marcaine and then made a longitudinal incision with a scalpel.  I took this down to the hernia sac which was completely identified circumferentially.  The sac was found to contain omentum.  I was actually able to imbricate the sac and involute back into the abdominal cavity and free up the fascia circumferentially around this. I then brought a piece of Prolene Proceed mesh onto the field.  This was a 4.3-cm round patch.  I placed it through the fascial opening and then pulled it up against the peritoneum with stay ties.  I then sewed it in place circumferentially with interrupted #1 Novafil sutures.  I then cut the stay ties and then closed the fascia over top of the mesh with a figure-of-eight #1 Novafil suture.  Wide coverage of the fascial defect appeared to be achieved and the fascial defect appeared to be closed well.  I then anesthetized the fascia skin  further with Marcaine. Hemostasis was achieved with cautery.  I closed the subcutaneous tissue with interrupted 3-0 Vicryl sutures and closed the skin with a running 4- 0 Monocryl. Steri-Strips, gauze, and Tegaderm were then applied.  The patient tolerated the procedure well.  All the counts were correct at the end of the procedure.  The patient was then extubated in the operating room and taken in stable condition to recovery room.     Coralie Keens, M.D.     DB/MEDQ  D:  10/20/2014  T:  10/20/2014  Job:  [id number]

## 2014-10-20 NOTE — Interval H&P Note (Signed)
History and Physical Interval Note: no change in H and P  10/20/2014 6:42 AM  Joshua Ellison  has presented today for surgery, with the diagnosis of Umbilical Hernia  The various methods of treatment have been discussed with the patient and family. After consideration of risks, benefits and other options for treatment, the patient has consented to  Procedure(s): OPEN UMBILICAL HERNIA REPAIR WITH MESH (N/A) INSERTION OF MESH (N/A) as a surgical intervention .  The patient's history has been reviewed, patient examined, no change in status, stable for surgery.  I have reviewed the patient's chart and labs.  Questions were answered to the patient's satisfaction.     Joshua Ellison A

## 2014-10-20 NOTE — Transfer of Care (Signed)
Immediate Anesthesia Transfer of Care Note  Patient: Joshua Ellison  Procedure(s) Performed: Procedure(s): OPEN UMBILICAL HERNIA REPAIR WITH MESH (N/A) INSERTION OF MESH (N/A)  Patient Location: PACU  Anesthesia Type:General  Level of Consciousness: awake, alert  and oriented  Airway & Oxygen Therapy: Patient Spontanous Breathing and Patient connected to nasal cannula oxygen  Post-op Assessment: Report given to PACU RN, Post -op Vital signs reviewed and stable and Patient moving all extremities  Post vital signs: Reviewed and stable  Complications: No apparent anesthesia complications

## 2014-10-20 NOTE — Anesthesia Postprocedure Evaluation (Signed)
  Anesthesia Post-op Note  Patient: Joshua Ellison  Procedure(s) Performed: Procedure(s): OPEN UMBILICAL HERNIA REPAIR WITH MESH (N/A) INSERTION OF MESH (N/A)  Patient Location: PACU  Anesthesia Type:General  Level of Consciousness: awake and alert   Airway and Oxygen Therapy: Patient Spontanous Breathing  Post-op Pain: mild  Post-op Assessment: Post-op Vital signs reviewed  Post-op Vital Signs: stable  Last Vitals:  Filed Vitals:   10/20/14 0845  BP: 114/77  Pulse: 62  Temp:   Resp: 15    Complications: No apparent anesthesia complications

## 2014-10-20 NOTE — Addendum Note (Signed)
Addendum  created 10/20/14 1725 by Moshe Salisbury, CRNA   Modules edited: Anesthesia Events, Narrator   Narrator:  Narrator: Event Log Edited

## 2014-10-20 NOTE — Op Note (Signed)
OPEN UMBILICAL HERNIA REPAIR WITH MESH, INSERTION OF MESH  Procedure Note  Sheri Prows 10/20/2014   Pre-op Diagnosis: Umbilical Hernia     Post-op Diagnosis: same  Procedure(s): OPEN UMBILICAL HERNIA REPAIR WITH MESH INSERTION OF MESH  Surgeon(s): Coralie Keens, MD  Anesthesia: General  Staff:  Circulator: Jaynie Crumble, RN Scrub Person: Leslie Andrea, CST; Sharlot Gowda, RN  Estimated Blood Loss: Minimal                         Laketha Leopard A   Date: 10/20/2014  Time: 8:10 AM

## 2014-10-20 NOTE — Progress Notes (Signed)
Report given to jamie rn as caregiver 

## 2014-10-20 NOTE — Discharge Instructions (Signed)
[facility]  UMBILICAL OR INGUINAL HERNIA REPAIR: POST OP INSTRUCTIONS  Always review your discharge instruction sheet given to you by the facility where your surgery was performed. IF YOU HAVE DISABILITY OR FAMILY LEAVE FORMS, YOU MUST BRING THEM TO THE OFFICE FOR PROCESSING.   DO NOT GIVE THEM TO YOUR DOCTOR.  1. A  prescription for pain medication may be given to you upon discharge.  Take your pain medication as prescribed, if needed.  If narcotic pain medicine is not needed, then you may take acetaminophen (Tylenol) or ibuprofen (Advil) as needed. 2. Take your usually prescribed medications unless otherwise directed. 3. If you need a refill on your pain medication, please contact your pharmacy.  They will contact our office to request authorization. Prescriptions will not be filled after 5 pm or on week-ends. 4. You should follow a light diet the first 24 hours after arrival home, such as soup and crackers, etc.  Be sure to include lots of fluids daily.  Resume your normal diet the day after surgery. 5. Most patients will experience some swelling and bruising around the umbilicus or in the groin and scrotum.  Ice packs and reclining will help.  Swelling and bruising can take several days to resolve.  6. It is common to experience some constipation if taking pain medication after surgery.  Increasing fluid intake and taking a stool softener (such as Colace) will usually help or prevent this problem from occurring.  A mild laxative (Milk of Magnesia or Miralax) should be taken according to package directions if there are no bowel movements after 48 hours. 7. Unless discharge instructions indicate otherwise, you may remove your bandages 24-48 hours after surgery, and you may shower at that time.  You may have steri-strips (small skin tapes) in place directly over the incision.  These strips should be left on the skin for 7-10 days.  If your surgeon used skin glue on the  incision, you may shower in 24 hours.  The glue will flake off over the next 2-3 weeks.  Any sutures or staples will be removed at the office during your follow-up visit. 8. ACTIVITIES:  You may resume regular (light) daily activities beginning the next day--such as daily self-care, walking, climbing stairs--gradually increasing activities as tolerated.  You may have sexual intercourse when it is comfortable.  Refrain from any heavy lifting or straining until approved by your doctor. a. You may drive when you are no longer taking prescription pain medication, you can comfortably wear a seatbelt, and you can safely maneuver your car and apply brakes. b. RETURN TO WORK:  __________________________________________________________ 9. You should see your doctor in the office for a follow-up appointment approximately 2-3 weeks after your surgery.  Make sure that you call for this appointment within a day or two after you arrive home to insure a convenient appointment time. 10. OTHER INSTRUCTIONS:  ____________________________NO LIFTING MORE THAN 15 POUNDS FOR 4 WEEKS. 11. ICE PACK AND IBUPROFEN ALSO FOR PAIN. 12. ______________________________________________________________________________________________________________________________________________________________  WHEN TO CALL YOUR DOCTOR: 1. Fever over 101.0 2. Inability to urinate 3. Nausea and/or vomiting 4. Extreme swelling or bruising 5. Continued bleeding from incision. 6. Increased pain, redness, or drainage from the incision  The clinic staff is available to answer your questions during regular business hours.  Please dont hesitate to call and ask to speak to one of the nurses for clinical concerns.  If you have a medical emergency, go to the nearest emergency room or  call 911.  A surgeon from [facility] is always on call at the hospital   [address] [place], [place], [place]  [address] ?  [address],  [place], [place]   [phone] ? [phone] ? FAX [phone] Web site: [email or web address]  What to eat:  For your first meals, you should eat lightly; only small meals initially.  If you do not have nausea, you may eat larger meals.  Avoid spicy, greasy and heavy food.    General Anesthesia, Adult, Care After  Refer to this sheet in the next few weeks. These instructions provide you with information on caring for yourself after your procedure. Your health care provider may also give you more specific instructions. Your treatment has been planned according to current medical practices, but problems sometimes occur. Call your health care provider if you have any problems or questions after your procedure.  WHAT TO EXPECT AFTER THE PROCEDURE  After the procedure, it is typical to experience:  Sleepiness.  Nausea and vomiting. HOME CARE INSTRUCTIONS  For the first 24 hours after general anesthesia:  Have a responsible person with you.  Do not drive a car. If you are alone, do not take public transportation.  Do not drink alcohol.  Do not take medicine that has not been prescribed by your health care provider.  Do not sign important papers or make important decisions.  You may resume a normal diet and activities as directed by your health care provider.  Change bandages (dressings) as directed.  If you have questions or problems that seem related to general anesthesia, call the hospital and ask for the anesthetist or anesthesiologist on call. SEEK MEDICAL CARE IF:  You have nausea and vomiting that continue the day after anesthesia.  You develop a rash. SEEK IMMEDIATE MEDICAL CARE IF:  You have difficulty breathing.  You have chest pain.  You have any allergic problems. Document Released: 01/27/2001 Document Revised: 06/23/2013 Document Reviewed: 05/06/2013  [facility] Patient Information 2014 [place], [place].

## 2014-10-21 ENCOUNTER — Encounter (HOSPITAL_COMMUNITY): Payer: Self-pay | Admitting: Surgery

## 2014-11-03 ENCOUNTER — Ambulatory Visit (INDEPENDENT_AMBULATORY_CARE_PROVIDER_SITE_OTHER): Payer: BC Managed Care – PPO | Admitting: Internal Medicine

## 2014-11-03 ENCOUNTER — Encounter: Payer: Self-pay | Admitting: Internal Medicine

## 2014-11-03 VITALS — BP 108/76 | Temp 98.4°F | Ht 69.0 in | Wt 218.3 lb

## 2014-11-03 DIAGNOSIS — B9789 Other viral agents as the cause of diseases classified elsewhere: Secondary | ICD-10-CM

## 2014-11-03 DIAGNOSIS — J029 Acute pharyngitis, unspecified: Secondary | ICD-10-CM

## 2014-11-03 DIAGNOSIS — J028 Acute pharyngitis due to other specified organisms: Principal | ICD-10-CM

## 2014-11-03 DIAGNOSIS — J22 Unspecified acute lower respiratory infection: Secondary | ICD-10-CM

## 2014-11-03 DIAGNOSIS — J988 Other specified respiratory disorders: Secondary | ICD-10-CM

## 2014-11-03 MED ORDER — HYDROCODONE-HOMATROPINE 5-1.5 MG/5ML PO SYRP: 5.0000 mL | ORAL_SOLUTION | ORAL | Status: DC | PRN

## 2014-11-03 NOTE — Progress Notes (Signed)
Pre visit review using our clinic review tool, if applicable. No additional management support is needed unless otherwise documented below in the visit note.  Chief Complaint  Patient presents with  . Cough  . Sore Throat  . Wheezing    HPI: Patient Joshua Ellison  comes in today for SDA for  new problem evaluation. pcp NA here with wife  Onset  A week with upper chest tight and then cough and  Then st very bad [place] to swallow  .  Sone had pharyngitis  Neg strep.   Son... Then wife got it.   And better . No fever  No tobacco  No  Asthma  Surgery .  3 weeks ago umbi hernia and not related  No abd pain  Has tried nyquil and throat    lozenges halls and menthol.   ibu and  Gargles  Has follow    Up with surgeon next week H ad open henria repaor 12 17  No underlying lung disease asthma ROS: See pertinent positives and negatives per HPI.  Past Medical History  Diagnosis Date  . SUPRAPUBIC PAIN 02/14/2009  . Arthritis     knees    Family History  Problem Relation Age of Onset  . Hyperlipidemia Mother   . Diabetes Father   . COPD Paternal Grandmother   . Colon cancer Neg Hx     History   Social History  . Marital Status: Married    Spouse Name: N/A    Number of Children: N/A  . Years of Education: N/A   Social History Main Topics  . Smoking status: Former Smoker    Quit date: 11/05/2003  . Smokeless tobacco: Never Used  . Alcohol Use: 8.4 oz/week    14 Cans of beer per week     Comment: 2 beers per day   . Drug Use: No  . Sexual Activity: Not on file   Other Topics Concern  . Not on file   Social History Narrative    Outpatient Encounter Prescriptions as of 11/03/2014  Medication Sig  . TESTOSTERONE CYPIONATE IM Inject 200 mg into the muscle every 30 (thirty) days.   . [DISCONTINUED] testosterone cypionate (DEPOTESTOTERONE CYPIONATE) 200 MG/ML injection Inject into the muscle every 14 (fourteen) days.  Marland Kitchen HYDROcodone-acetaminophen (NORCO) 5-325 MG per tablet Take  1-2 tablets by mouth every 4 (four) hours as needed. (Patient not taking: Reported on 11/03/2014)  . HYDROcodone-homatropine (HYCODAN) 5-1.5 MG/5ML syrup Take 5 mLs by mouth every 4 (four) hours as needed for cough.  Marland Kitchen ibuprofen (ADVIL,MOTRIN) 200 MG tablet Take 600 mg by mouth every 6 (six) hours as needed. For pain  . [DISCONTINUED] Testosterone (ANDROGEL PUMP) 20.25 MG/ACT (1.62%) GEL 3 pump actuations to shoulder and upper arm once daily (Patient not taking: Reported on 10/10/2014)    EXAM:  BP 108/76 mmHg  Temp(Src) 98.4 F (36.9 C) (Oral)  Ht 5\' 9"  (1.753 m)  Wt 218 lb 4.8 oz (99.02 kg)  BMI 32.22 kg/m2  Body mass index is 32.22 kg/(m^2).  GENERAL: vitals reviewed and listed above, alert, oriented, appears well hydrated and in no acute distress congested non toxic  HEENT: atraumatic, conjunctiva  clear, no obvious abnormalities on inspection of external nose and ears nares mild ongetsion OP : no lesion edema or exudate  Mild erythen mild haorse NECK: no obvious masses on inspection palpation  LUNGS: clear to auscultation bilaterally, no wheezes, rales or rhonchi, good air movement CV: HRRR, no clubbing cyanosis or  peripheral edema nl cap refill  abd healing scar mibulge around incision no redness or warmth MS: moves all extremities without noticeable focal  abnormality PSYCH: pleasant and cooperative, no obvious depression or anxiety  ASSESSMENT AND PLAN:  Discussed the following assessment and plan:  Sore throat (viral)  Acute respiratory infection currently fits with viral that spread through family no complication at this time  Can use cough med at night for comfort  But can use tea and honey also .  change cough drops    Expectant management. And fu for alam sx   reviewed -Patient advised to return or notify health care team  if symptoms worsen ,persist or new concerns arise.  Patient Instructions  Gargles  Warm salt .    Stop the lozenges  and change to  sugar free  candy .  To avoid throat irrirtation  This acts like a viral respiratory infection. Should resolve on its own.  Cough may last a few weeks  Contact us if getting fever shortness of breath  Other concerns.     Cough med at night if needed .     Standley Brooking. Zaley Talley M.D.

## 2014-11-03 NOTE — Patient Instructions (Signed)
Gargles  Warm salt .    Stop the lozenges  and change to  sugar free candy .  To avoid throat irrirtation  This acts like a viral respiratory infection. Should resolve on its own.  Cough may last a few weeks  Contact us if getting fever shortness of breath  Other concerns.     Cough med at night if needed .

## 2015-04-25 ENCOUNTER — Other Ambulatory Visit: Payer: Self-pay

## 2015-04-25 ENCOUNTER — Encounter: Payer: Self-pay | Admitting: Family Medicine

## 2015-04-25 DIAGNOSIS — Z299 Encounter for prophylactic measures, unspecified: Secondary | ICD-10-CM

## 2015-04-25 MED ORDER — SCOPOLAMINE 1 MG/3DAYS TD PT72: MEDICATED_PATCH | TRANSDERMAL | Status: DC

## 2015-10-22 ENCOUNTER — Encounter (HOSPITAL_COMMUNITY): Payer: Self-pay | Admitting: Emergency Medicine

## 2015-10-22 ENCOUNTER — Emergency Department (HOSPITAL_COMMUNITY): Payer: BLUE CROSS/BLUE SHIELD

## 2015-10-22 ENCOUNTER — Emergency Department (HOSPITAL_COMMUNITY)
Admission: EM | Admit: 2015-10-22 | Discharge: 2015-10-22 | Disposition: A | Discharge status: Home / Self Care | Payer: BLUE CROSS/BLUE SHIELD | Attending: Emergency Medicine | Admitting: Emergency Medicine

## 2015-10-22 DIAGNOSIS — Y998 Other external cause status: Secondary | ICD-10-CM | POA: Insufficient documentation

## 2015-10-22 DIAGNOSIS — Y9389 Activity, other specified: Secondary | ICD-10-CM | POA: Diagnosis not present

## 2015-10-22 DIAGNOSIS — S5001XA Contusion of right elbow, initial encounter: Secondary | ICD-10-CM | POA: Insufficient documentation

## 2015-10-22 DIAGNOSIS — Z87891 Personal history of nicotine dependence: Secondary | ICD-10-CM | POA: Insufficient documentation

## 2015-10-22 DIAGNOSIS — Y9289 Other specified places as the place of occurrence of the external cause: Secondary | ICD-10-CM | POA: Diagnosis not present

## 2015-10-22 DIAGNOSIS — W108XXA Fall (on) (from) other stairs and steps, initial encounter: Secondary | ICD-10-CM | POA: Diagnosis not present

## 2015-10-22 DIAGNOSIS — M170 Bilateral primary osteoarthritis of knee: Secondary | ICD-10-CM | POA: Diagnosis not present

## 2015-10-22 DIAGNOSIS — S60221A Contusion of right hand, initial encounter: Secondary | ICD-10-CM | POA: Diagnosis not present

## 2015-10-22 DIAGNOSIS — S20221A Contusion of right back wall of thorax, initial encounter: Secondary | ICD-10-CM | POA: Insufficient documentation

## 2015-10-22 DIAGNOSIS — S6991XA Unspecified injury of right wrist, hand and finger(s), initial encounter: Secondary | ICD-10-CM | POA: Diagnosis present

## 2015-10-22 MED ORDER — OXYCODONE-ACETAMINOPHEN 5-325 MG PO TABS: 1.0000 | ORAL_TABLET | Freq: Four times a day (QID) | ORAL | Status: DC | PRN

## 2015-10-22 MED ORDER — MELOXICAM 7.5 MG PO TABS: 15.0000 mg | ORAL_TABLET | Freq: Every day | ORAL | Status: DC

## 2015-10-22 MED ORDER — OXYCODONE-ACETAMINOPHEN 5-325 MG PO TABS
2.0000 | ORAL_TABLET | Freq: Once | ORAL | Status: AC
  Administered 2015-10-22: 2 via ORAL
  Filled 2015-10-22: qty 2

## 2015-10-22 NOTE — ED Notes (Signed)
Respiratory in room.

## 2015-10-22 NOTE — Discharge Instructions (Signed)
Take Mobic and Percocet as prescribed for pain control. Ice areas of injury. Use an incentive spirometer once per hour while awake to ensure that you are taking deep breaths. Follow-up with your primary care doctor for a recheck of symptoms in one week.  Contusion A contusion is a deep bruise. Contusions are the result of a blunt injury to tissues and muscle fibers under the skin. The injury causes bleeding under the skin. The skin overlying the contusion may turn blue, purple, or yellow. Minor injuries will give you a painless contusion, but more severe contusions may stay painful and swollen for a few weeks.  CAUSES  This condition is usually caused by a blow, trauma, or direct force to an area of the body. SYMPTOMS  Symptoms of this condition include:  Swelling of the injured area.  Pain and tenderness in the injured area.  Discoloration. The area may have redness and then turn blue, purple, or yellow. DIAGNOSIS  This condition is diagnosed based on a physical exam and medical history. An X-ray, CT scan, or MRI may be needed to determine if there are any associated injuries, such as broken bones (fractures). TREATMENT  Specific treatment for this condition depends on what area of the body was injured. In general, the best treatment for a contusion is resting, icing, applying pressure to (compression), and elevating the injured area. This is often called the RICE strategy. Over-the-counter anti-inflammatory medicines may also be recommended for pain control.  HOME CARE INSTRUCTIONS   Rest the injured area.  If directed, apply ice to the injured area:  Put ice in a plastic bag.  Place a towel between your skin and the bag.  Leave the ice on for 20 minutes, 2-3 times per day.  If directed, apply light compression to the injured area using an elastic bandage. Make sure the bandage is not wrapped too tightly. Remove and reapply the bandage as directed by your health care provider.  If  possible, raise (elevate) the injured area above the level of your heart while you are sitting or lying down.  Take over-the-counter and prescription medicines only as told by your health care provider. SEEK MEDICAL CARE IF:  Your symptoms do not improve after several days of treatment.  Your symptoms get worse.  You have difficulty moving the injured area. SEEK IMMEDIATE MEDICAL CARE IF:   You have severe pain.  You have numbness in a hand or foot.  Your hand or foot turns pale or cold.   This information is not intended to replace advice given to you by your health care provider. Make sure you discuss any questions you have with your health care provider.   Document Released: 07/31/2005 Document Revised: 07/12/2015 Document Reviewed: 03/08/2015 Elsevier Interactive Patient Education [employer].

## 2015-10-22 NOTE — ED Provider Notes (Signed)
CSN: [id number]     Arrival date & time 10/22/15  0013 History   First MD Initiated Contact with Patient 10/22/15 0224     Chief Complaint  Patient presents with  . Fall     (Consider location/radiation/quality/duration/timing/severity/associated sxs/prior Treatment) HPI Comments: 54 year old male with no significant past medical history presents to the emergency department for evaluation of a fall. He reports slipping down 3 wooden steps causing him to strike his right back, right elbow, and right hand. He reports that his pain is the worst in his right chest wall. It is aggravated with deep breathing. He rates this pain 9/10. No medications taken prior to arrival. No head trauma or loss of consciousness. Patient has no low-back pain. He states that he had a few beers this evening as he and his wife had some friends over for a gathering. No extremity numbness or weakness. No bowel/bladder incontinence.  Patient is a 54 y.o. male presenting with fall. The history is provided by the patient and the spouse. No language interpreter was used.  Fall Associated symptoms include arthralgias, chest pain and myalgias.    Past Medical History  Diagnosis Date  . SUPRAPUBIC PAIN 02/14/2009  . Arthritis     knees   Past Surgical History  Procedure Laterality Date  . Tonsillectomy  1970  . Vasectomy  2007  . Knee arthroscopy w/ acl reconstruction Left 1995  . Umbilical hernia repair N/A 10/20/2014    Procedure: OPEN UMBILICAL HERNIA REPAIR WITH MESH;  Surgeon: Coralie Keens, MD;  Location: [place];  Service: General;  Laterality: N/A;  . Insertion of mesh N/A 10/20/2014    Procedure: INSERTION OF MESH;  Surgeon: Coralie Keens, MD;  Location: [place];  Service: General;  Laterality: N/A;   Family History  Problem Relation Age of Onset  . Hyperlipidemia Mother   . Diabetes Father   . COPD Paternal Grandmother   . Colon cancer Neg Hx    Social History  Substance Use Topics  . Smoking  status: Former Smoker    Quit date: 11/05/2003  . Smokeless tobacco: Never Used  . Alcohol Use: 8.4 oz/week    14 Cans of beer per week     Comment: 2 beers per day     Review of Systems  Respiratory: Negative for shortness of breath.   Cardiovascular: Positive for chest pain.  Musculoskeletal: Positive for myalgias, back pain and arthralgias.  All other systems reviewed and are negative.   Allergies  Review of patient's allergies indicates no known allergies.  Home Medications   Prior to Admission medications   Medication Sig Start Date End Date Taking? Authorizing Provider  HYDROcodone-acetaminophen (NORCO) 5-325 MG per tablet Take 1-2 tablets by mouth every 4 (four) hours as needed. Patient not taking: Reported on 11/03/2014 10/20/14   Coralie Keens, MD  HYDROcodone-homatropine [facility]) 5-1.5 MG/5ML syrup Take 5 mLs by mouth every 4 (four) hours as needed for cough. 11/03/14   Burnis Medin, MD  ibuprofen (ADVIL,MOTRIN) 200 MG tablet Take 600 mg by mouth every 6 (six) hours as needed. For pain    Historical Provider, MD  meloxicam (MOBIC) 7.5 MG tablet Take 2 tablets (15 mg total) by mouth daily. 10/22/15   Antonietta Breach, PA-C  oxyCODONE-acetaminophen (PERCOCET/ROXICET) 5-325 MG tablet Take 1-2 tablets by mouth every 6 (six) hours as needed for severe pain. 10/22/15   Antonietta Breach, PA-C  scopolamine (TRANSDERM-SCOP, 1.5 MG,) 1 MG/3DAYS Place 1 patch behind ear every 3 days 04/25/15  Eulas Post, MD  TESTOSTERONE CYPIONATE IM Inject 200 mg into the muscle every 30 (thirty) days.     Historical Provider, MD   BP 113/73 mmHg  Pulse 67  Temp(Src) 97.9 F (36.6 C)  Resp 18  SpO2 100%   Physical Exam  Constitutional: He is oriented to person, place, and time. He appears well-developed and well-nourished. No distress.  Nontoxic/nonseptic appearing  HENT:  Head: Normocephalic and atraumatic.  Eyes: Conjunctivae and EOM are normal. No scleral icterus.  Neck: Normal range  of motion.  Cardiovascular: Normal rate, regular rhythm and intact distal pulses.   Pulmonary/Chest: Effort normal. No respiratory distress. He has no wheezes. He has no rales. He exhibits tenderness.  Right posterior chest wall tenderness to palpation. No bony deformity or crepitus. Chest expansion symmetric. Lung sounds clear. No tachypnea or dyspnea.  Musculoskeletal: Normal range of motion.       Right elbow: He exhibits swelling (mild). He exhibits no effusion and no deformity. Tenderness found. Lateral epicondyle and olecranon process tenderness noted.       Right wrist: Normal.       Thoracic back: He exhibits tenderness and bony tenderness (posterior ribs/chest wall).       Back:       Right upper arm: Normal.       Right forearm: Normal.       Right hand: He exhibits tenderness and swelling (mild). He exhibits normal range of motion, no bony tenderness, normal two-point discrimination, normal capillary refill and no deformity. Normal sensation noted. Normal strength noted.       Hands: Neurological: He is alert and oriented to person, place, and time. He exhibits normal muscle tone. Coordination normal.  GCS 15. Patient moving all extremities.  Skin: Skin is warm and dry. No rash noted. He is not diaphoretic. No erythema. No pallor.  Psychiatric: He has a normal mood and affect. His behavior is normal.  Nursing note and vitals reviewed.   ED Course  Procedures (including critical care time) Labs Review Labs Reviewed - No data to display  Imaging Review Dg Ribs Unilateral W/chest Right  10/22/2015  CLINICAL DATA:  Posterior right rib pain. Fell down with known stairs x2 hours. EXAM: RIGHT RIBS AND CHEST - 3+ VIEW COMPARISON:  Chest 10/09/2007 FINDINGS: Shallow inspiration with linear atelectasis in the lung bases. Normal heart size and pulmonary vascularity. Calcified granuloma in the left mid lung. No focal consolidation. No blunting of costophrenic angles. No pneumothorax.  Mediastinal contours appear intact. Right ribs appear intact. No acute displaced fractures are identified. No focal bone lesion or bone destruction. IMPRESSION: Shallow inspiration with linear atelectasis in the lung bases. Negative right ribs. Electronically Signed   By: Lucienne Capers M.D.   On: 10/22/2015 03:07   Dg Elbow Complete Right  10/22/2015  CLINICAL DATA:  Patient fell down within stairs 2 hours ago. Right elbow pain. EXAM: RIGHT ELBOW - COMPLETE 3+ VIEW COMPARISON:  None. FINDINGS: Mild degenerative spurring in the right elbow. Small olecranon spur. No evidence of acute fracture or dislocation. No focal bone lesion or bone destruction. No significant effusion. Soft tissues are unremarkable. IMPRESSION: Mild degenerative changes.  No acute fracture or dislocation. Electronically Signed   By: Lucienne Capers M.D.   On: 10/22/2015 03:19   I have personally reviewed and evaluated these images and lab results as part of my medical decision-making.   EKG Interpretation None      MDM   Final diagnoses:  Contusion, back, right, initial encounter  Elbow contusion, right, initial encounter  Hand contusion, right, initial encounter    54 year old male presents to the emergency department after a fall. He denies head trauma or loss of consciousness. He has no low back pain, red flags, or signs concerning for cauda equina. Patient is neurovascularly intact, complaining mostly of right posterior rib pain which is pleuritic. He also has pain to his right elbow and his right thumb sustained from the fall. Imaging of ribs and elbow today show no evidence of fracture or bony deformity. Symptoms consistent with contusion, appropriate for management on an outpatient basis. Patient discharged with NSAIDs and Percocet for pain control. He has been given an incentive spirometer to use to ensure deep breathing. Patient referred to his primary care doctor for recheck. Return precautions given at  discharge. Patient discharged in good condition with no unaddressed concerns.   Filed Vitals:   10/22/15 0019 10/22/15 0235  BP: 109/71 113/73  Pulse: 59 67  Temp: 98 F (36.7 C) 97.9 F (36.6 C)  Resp: 16 18  SpO2: 98% 100%     Antonietta Breach, PA-C 10/22/15 [place], MD 10/22/15 [phone]

## 2015-10-22 NOTE — ED Notes (Signed)
Pt st's he was going down steps in his socks, slid and fell.  Pt c/o pain to palm of right hand, right elbow and right rib area

## 2015-10-30 ENCOUNTER — Emergency Department (HOSPITAL_COMMUNITY)
Admission: EM | Admit: 2015-10-30 | Discharge: 2015-10-30 | Disposition: A | Discharge status: Home / Self Care | Payer: BLUE CROSS/BLUE SHIELD | Source: Home / Self Care | Attending: Emergency Medicine | Admitting: Emergency Medicine

## 2015-10-30 ENCOUNTER — Encounter (HOSPITAL_COMMUNITY): Payer: Self-pay | Admitting: Emergency Medicine

## 2015-10-30 ENCOUNTER — Emergency Department (INDEPENDENT_AMBULATORY_CARE_PROVIDER_SITE_OTHER): Payer: BLUE CROSS/BLUE SHIELD

## 2015-10-30 DIAGNOSIS — S2231XA Fracture of one rib, right side, initial encounter for closed fracture: Secondary | ICD-10-CM

## 2015-10-30 MED ORDER — OXYCODONE-ACETAMINOPHEN 10-325 MG PO TABS: 1.0000 | ORAL_TABLET | ORAL | Status: DC | PRN

## 2015-10-30 MED ORDER — MELOXICAM 7.5 MG PO TABS: 15.0000 mg | ORAL_TABLET | Freq: Every day | ORAL | Status: DC

## 2015-10-30 MED ORDER — MORPHINE SULFATE (PF) 2 MG/ML IV SOLN
INTRAVENOUS | Status: AC
  Filled 2015-10-30: qty 1

## 2015-10-30 MED ORDER — MORPHINE SULFATE (PF) 2 MG/ML IV SOLN
2.0000 mg | Freq: Once | INTRAVENOUS | Status: AC
  Administered 2015-10-30: 2 mg via INTRAMUSCULAR

## 2015-10-30 NOTE — ED Notes (Signed)
Patient reports rib pain with inspiration since 12/24.  Patient has had a fall 12/18 and xrays were negative.

## 2015-10-30 NOTE — ED Provider Notes (Addendum)
CSN: [id number]     Arrival date & time 10/30/15  1308 History   First MD Initiated Contact with Patient 10/30/15 1502     Chief Complaint  Patient presents with  . Rib Injury   (Consider location/radiation/quality/duration/timing/severity/associated sxs/prior Treatment) HPI  He is a 54 year old man here for evaluation of rib pain. He states he fell on the 18th. He was going down the stairs when his feet slipped out from under him. He states he caught a rib on the edge of the stair. He was seen and evaluated in the emergency room that evening. Rib films were negative for fracture. He was given anti-inflammatories and pain medicine. He states he was improving over last week and was almost back to normal on Friday.  Friday afternoon he moved something heavy from one car to another, which triggered some pain. He and his wife state the pain has been really bad since then. He reports extreme pain with coughing and movement. He denies any shortness of breath. He states he can feel something crunching and popping in his right mid back.  He also states he can press on about the sixth rib anteriorly and feel it radiate through to his back. He has been taking the Percocet with minimal improvement in pain.  Past Medical History  Diagnosis Date  . SUPRAPUBIC PAIN 02/14/2009   Past Surgical History  Procedure Laterality Date  . Tonsillectomy  1970  . Vasectomy  2007  . Knee arthroscopy w/ acl reconstruction Left 1995  . Umbilical hernia repair N/A 10/20/2014    Procedure: OPEN UMBILICAL HERNIA REPAIR WITH MESH;  Surgeon: Coralie Keens, MD;  Location: [place];  Service: General;  Laterality: N/A;  . Insertion of mesh N/A 10/20/2014    Procedure: INSERTION OF MESH;  Surgeon: Coralie Keens, MD;  Location: [place];  Service: General;  Laterality: N/A;   Family History  Problem Relation Age of Onset  . Hyperlipidemia Mother   . Diabetes Father   . COPD Paternal Grandmother   . Colon cancer Neg Hx     Social History  Substance Use Topics  . Smoking status: Former Smoker    Quit date: 11/05/2003  . Smokeless tobacco: Never Used  . Alcohol Use: 8.4 oz/week    14 Cans of beer per week     Comment: 2 beers per day     Review of Systems As in history of present illness Allergies  Review of patient's allergies indicates no known allergies.  Home Medications   Prior to Admission medications   Medication Sig Start Date End Date Taking? Authorizing Provider  ibuprofen (ADVIL,MOTRIN) 200 MG tablet Take 600 mg by mouth every 6 (six) hours as needed. For pain    Historical Provider, MD  meloxicam (MOBIC) 7.5 MG tablet Take 2 tablets (15 mg total) by mouth daily. 10/30/15   Melony Overly, MD  oxyCODONE-acetaminophen (PERCOCET) 10-325 MG tablet Take 1 tablet by mouth every 4 (four) hours as needed for pain. 10/30/15   Melony Overly, MD  TESTOSTERONE CYPIONATE IM Inject 200 mg into the muscle every 30 (thirty) days.     Historical Provider, MD   Meds Ordered and Administered this Visit   Medications  morphine 2 MG/ML injection 2 mg (2 mg Intramuscular Given 10/30/15 1616)    BP 113/74 mmHg  Pulse 61  Temp(Src) 97.2 F (36.2 C) (Oral)  Resp 18  SpO2 98% No data found.   Physical Exam  Constitutional: He is oriented to  person, place, and time. He appears well-developed and well-nourished. He appears distressed (mild).  Neck: Neck supple.  Cardiovascular: Normal rate.   Pulmonary/Chest: Effort normal and breath sounds normal. No respiratory distress. He has no wheezes. He has no rales.    Neurological: He is alert and oriented to person, place, and time.    ED Course  Procedures (including critical care time)  Labs Review Labs Reviewed - No data to display  Imaging Review Dg Ribs Unilateral W/chest Right  10/30/2015  CLINICAL DATA:  54 year old male with back and chest pain EXAM: RIGHT RIBS AND CHEST - 3+ VIEW COMPARISON:  Prior chest x-ray and right rib series 1218 2016  FINDINGS: Stable appearance of the lungs with low inspiratory volumes and mild bibasilar atelectasis. Cardiac and mediastinal contours remain within normal limits. Trace biapical pleural parenchymal scarring. No pneumothorax or focal airspace consolidation. There is an acute displaced fracture of the posterolateral aspect of the right eighth rib. Fracture not evident on recent prior imaging even in retrospect. IMPRESSION: Positive for acute displaced fracture of the posterolateral aspect of the right eighth rib. Electronically Signed   By: Jacqulynn Cadet M.D.   On: 10/30/2015 15:48     MDM   1. Right rib fracture, closed, initial encounter    Pain management with meloxicam and Percocet. I provided prescriptions for meloxicam as well as Percocet 10-325 milligrams tablets. Emphasized importance of deep breaths. He does have an incentive spirometer from the emergency room. Discussed no heavy lifting or strenuous activity for the next several weeks to allow the bones to heal. Return precautions reviewed. Follow-up with PCP in the next week for recheck. Morphine 2mg  IM given for pain.   Melony Overly, MD 10/30/15 [place] Sebastian Lurz, MD 12/03/15 [phone]

## 2015-10-30 NOTE — Discharge Instructions (Signed)
You do have a rib fracture. It is slightly out of alignment. Continue the meloxicam daily. Take 1 Percocet 10-325mg  every 4 hours as needed for pain. Make sure you continue to take deep breaths regularly. If you develop a fever or swelling become short of breath, please go to the emergency room. Follow-up with your primary care doctor later this week or early next week.

## 2015-10-31 ENCOUNTER — Telehealth: Payer: Self-pay | Admitting: Family Medicine

## 2015-10-31 ENCOUNTER — Other Ambulatory Visit: Payer: Self-pay | Admitting: Family Medicine

## 2015-10-31 MED ORDER — ONDANSETRON HCL 4 MG PO TABS: 4.0000 mg | ORAL_TABLET | Freq: Three times a day (TID) | ORAL | Status: DC | PRN

## 2015-10-31 NOTE — Telephone Encounter (Signed)
Pt will need to be seen for follow-up to discuss change in pain medication.

## 2015-10-31 NOTE — Telephone Encounter (Signed)
Patient calling back to express his frustration with our phone system. He is upset that that there was prompt to speak with a RN but ended up talking with a scheduler.

## 2015-10-31 NOTE — Telephone Encounter (Signed)
There is no Oxycodone patch.  We will need to see him to assess and discuss other pain treatment options.  We cannot write for another opioid when we have not evaluated. him.

## 2015-10-31 NOTE — Telephone Encounter (Signed)
Patient's wife called in with concerns that change in pain medication has not been addressed. Spoke with Joshua Ellison and he advised to call in Zofran 4 mg PO until Dr. Elease Hashimoto can address request of pain patch opposed to PO tomorrow. Told patient I would address with Dr. Elease Hashimoto when he arrives back to office tomorrow morning. Zofran has been called in and patient is aware. Will follow-up tomorrow morning

## 2015-10-31 NOTE — Telephone Encounter (Signed)
Joshua Ellison called saying last week he went to the ER after having fallen. Yesterday he went to [facility] and it was determined after an xray that he has a broken rib. He was given Oxycodone and it upset his stomach. He's wondering if he can be given a patch for Oxycodone instead. He'd like a phone call regarding this.  Pt's ph# [phone] Thank you.

## 2015-11-01 ENCOUNTER — Encounter: Payer: Self-pay | Admitting: Family Medicine

## 2015-11-01 ENCOUNTER — Ambulatory Visit (INDEPENDENT_AMBULATORY_CARE_PROVIDER_SITE_OTHER): Payer: BLUE CROSS/BLUE SHIELD | Admitting: Family Medicine

## 2015-11-01 VITALS — BP 126/70 | HR 76 | Temp 97.9°F | Wt 221.0 lb

## 2015-11-01 DIAGNOSIS — S2231XD Fracture of one rib, right side, subsequent encounter for fracture with routine healing: Secondary | ICD-10-CM

## 2015-11-01 MED ORDER — LIDOCAINE 5 % EX PTCH: 1.0000 | MEDICATED_PATCH | CUTANEOUS | Status: DC

## 2015-11-01 MED ORDER — HYDROMORPHONE HCL 2 MG PO TABS: 2.0000 mg | ORAL_TABLET | Freq: Four times a day (QID) | ORAL | Status: DC | PRN

## 2015-11-01 NOTE — Telephone Encounter (Signed)
I spoke with Joshua Ellison this morning and offered him a 1PM appointment. He said he would not  Have a ride at that time. I asked what would be a good time for him and he wanted to know why he needed to come in when he was just seen at [facility]. I explained that Dr Elease Hashimoto would need to see him in order to change his narcotic pain medication. He said that at this point he was OK on the current narcotic combined with the Zofran. I advised him to call us back if his pain became unmanageable or he decided that he would like to come into the office to be seen.

## 2015-11-01 NOTE — Progress Notes (Signed)
   Subjective:    Patient ID: Joshua Ellison, male    DOB: June 04, 1961, 54 y.o.   MRN: [id number]  HPI Patient is seen to evaluate pain regarding right eighth posterior rib fracture Initial injury occurred on 17th of December. He was going down some steps and socks and slipped and fell backwards striking his right elbow and right posterior rib cage.  Patient went to emergency department. X-rays did not reveal obvious fracture. He was prescribed oxycodone but was controlling his pain fairly well with just ibuprofen and meloxicam. DN on December 24 Powers some lifting and felt a pop sensation and had severe pain at that time. He went back to emergency department 26 th and x-rays revealed displaced right posterior eighth rib fracture. No pneumothorax.  He's been taking some oxycodone but has had nausea without vomiting a few days ago. He had some Zofran called in yesterday. Current pain is severe at times. He thinks the oxycodone is causing nausea. No dyspnea   Review of Systems  Constitutional: Negative for fever and chills.  Respiratory: Negative for cough and shortness of breath.        Objective:   Physical Exam  Constitutional: He appears well-developed and well-nourished. No distress.  Cardiovascular: Normal rate and regular rhythm.   Pulmonary/Chest: Effort normal and breath sounds normal. No respiratory distress. He has no wheezes. He has no rales.          Assessment & Plan:  Right eighth posterior rib fracture with some displacement. Moderate pain. Probable nausea with oxycodone. We'll try down a lot and 2 mg 1-2 every 4-6 hours for severe pain #40 with no refill. Also try Lidoderm patch. He has incentive spirometer to use at home

## 2015-11-01 NOTE — Telephone Encounter (Signed)
Spoke with wife and patient is coming in today at 1:00pm for appointment

## 2015-11-01 NOTE — Telephone Encounter (Signed)
You have no open appointments on the schedule for today. Do you want [place] to work him in for today or sometime this week? Please advise.

## 2015-11-01 NOTE — Patient Instructions (Signed)

## 2015-11-01 NOTE — Telephone Encounter (Signed)
Please review

## 2016-09-09 DIAGNOSIS — M9902 Segmental and somatic dysfunction of thoracic region: Secondary | ICD-10-CM | POA: Diagnosis not present

## 2016-09-09 DIAGNOSIS — M791 Myalgia: Secondary | ICD-10-CM | POA: Diagnosis not present

## 2016-09-09 DIAGNOSIS — M5032 Other cervical disc degeneration, mid-cervical region, unspecified level: Secondary | ICD-10-CM | POA: Diagnosis not present

## 2016-09-09 DIAGNOSIS — M9901 Segmental and somatic dysfunction of cervical region: Secondary | ICD-10-CM | POA: Diagnosis not present

## 2016-09-24 ENCOUNTER — Ambulatory Visit (INDEPENDENT_AMBULATORY_CARE_PROVIDER_SITE_OTHER): Payer: BLUE CROSS/BLUE SHIELD | Admitting: Family Medicine

## 2016-09-24 ENCOUNTER — Telehealth: Payer: Self-pay | Admitting: Family Medicine

## 2016-09-24 ENCOUNTER — Encounter: Payer: Self-pay | Admitting: Family Medicine

## 2016-09-24 VITALS — BP 100/80 | HR 67 | Ht 69.0 in | Wt 184.0 lb

## 2016-09-24 DIAGNOSIS — L237 Allergic contact dermatitis due to plants, except food: Secondary | ICD-10-CM

## 2016-09-24 MED ORDER — METHYLPREDNISOLONE ACETATE 80 MG/ML IJ SUSP
80.0000 mg | Freq: Once | INTRAMUSCULAR | Status: AC
  Administered 2016-09-24: 80 mg via INTRAMUSCULAR

## 2016-09-24 NOTE — Telephone Encounter (Signed)
Pt needs an appointment

## 2016-09-24 NOTE — Progress Notes (Signed)
Subjective:     Patient ID: Joshua Ellison, male   DOB: 23-Mar-1961, 55 y.o.   MRN: [id number]  HPI Patient seen with pruritic rash right forearm with onset Saturday. He was concerned about contact dermatitis. He was putting up a deer stand recently and thinks that he may been in contact then. Did not actually see poison ivy plant. He tried some type of over-the-counter topicals without much improvement. No other rash. No exacerbating or alleviating factors  Past Medical History:  Diagnosis Date  . SUPRAPUBIC PAIN 02/14/2009   Past Surgical History:  Procedure Laterality Date  . INSERTION OF MESH N/A 10/20/2014   Procedure: INSERTION OF MESH;  Surgeon: Coralie Keens, MD;  Location: [place];  Service: General;  Laterality: N/A;  . KNEE ARTHROSCOPY W/ ACL RECONSTRUCTION Left 1995  . TONSILLECTOMY  1970  . UMBILICAL HERNIA REPAIR N/A 10/20/2014   Procedure: OPEN UMBILICAL HERNIA REPAIR WITH MESH;  Surgeon: Coralie Keens, MD;  Location: [place];  Service: General;  Laterality: N/A;  . VASECTOMY  2007    reports that he quit smoking about 12 years ago. He has never used smokeless tobacco. He reports that he drinks about 8.4 oz of alcohol per week . He reports that he does not use drugs. family history includes COPD in his paternal grandmother; Diabetes in his father; Hyperlipidemia in his mother. No Known Allergies   Review of Systems  Constitutional: Negative for chills and fever.  Skin: Positive for rash.       Objective:   Physical Exam  Constitutional: He appears well-developed and well-nourished.  Cardiovascular: Normal rate and regular rhythm.   Pulmonary/Chest: Effort normal and breath sounds normal. No respiratory distress. He has no wheezes. He has no rales.  Skin: Rash noted.  Patient scattered rash on his right forearm with erythematous base and vesicular surface some in linear distribution       Assessment:     Contact dermatitis right forearm    Plan:     -Offered  steroids-by mouth versus intramuscular and he prefers the latter -Depo-Medrol 80 mg IM given -He is encouraged to set up complete physical  Eulas Post MD [facility]

## 2016-09-24 NOTE — Patient Instructions (Signed)
Poison Ivy Dermatitis Introduction Poison ivy dermatitis is inflammation of the skin that is caused by the allergens on the leaves of the poison ivy plant. The skin reaction often involves redness, swelling, blisters, and extreme itching. What are the causes? This condition is caused by a specific chemical (urushiol) found in the sap of the poison ivy plant. This chemical is sticky and can be easily spread to people, animals, and objects. You can get poison ivy dermatitis by:  Having direct contact with a poison ivy plant.  Touching animals, other people, or objects that have come in contact with poison ivy and have the chemical on them. What increases the risk? This condition is more likely to develop in:  People who are outdoors often.  People who go outdoors without wearing protective clothing, such as closed shoes, long pants, and a long-sleeved shirt. What are the signs or symptoms? Symptoms of this condition include:  Redness and itching.  A rash that often includes bumps and blisters. The rash usually appears 48 hours after exposure.  Swelling. This may occur if the reaction is more severe. Symptoms usually last for 1-2 weeks. However, the first time you develop this condition, symptoms may last 3-4 weeks. How is this diagnosed? This condition may be diagnosed based on your symptoms and a physical exam. Your health care provider may also ask you about any recent outdoor activity. How is this treated? Treatment for this condition will vary depending on how severe it is. Treatment may include:  Hydrocortisone creams or calamine lotions to relieve itching.  Oatmeal baths to soothe the skin.  Over-the-counter antihistamine tablets.  Oral steroid medicine for more severe outbreaks. Follow these instructions at home:  Take or apply over-the-counter and prescription medicines only as told by your health care provider.  Wash exposed skin as soon as possible with soap and cold  water.  Use hydrocortisone creams or calamine lotion as needed to soothe the skin and relieve itching.  Take oatmeal baths as needed. Use colloidal oatmeal. You can get this at your local pharmacy or grocery store. Follow the instructions on the packaging.  Do not scratch or rub your skin.  While you have the rash, wash clothes right after you wear them. How is this prevented?  Learn to identify the poison ivy plant and avoid contact with the plant. This plant can be recognized by the number of leaves. Generally, poison ivy has three leaves with flowering branches on a single stem. The leaves are typically glossy, and they have jagged edges that come to a point at the front.  If you have been exposed to poison ivy, thoroughly wash with soap and water right away. You have about 30 minutes to remove the plant resin before it will cause the rash. Be sure to wash under your fingernails because any plant resin there will continue to spread the rash.  When hiking or camping, wear clothes that will help you to avoid exposure on the skin. This includes long pants, a long-sleeved shirt, tall socks, and hiking boots. You can also apply preventive lotion to your skin to help limit exposure.  If you suspect that your clothes or outdoor gear came in contact with poison ivy, rinse them off outside with a garden hose before you bring them inside your house. Contact a health care provider if:  You have open sores in the rash area.  You have more redness, swelling, or pain in the affected area.  You have redness that spreads  beyond the rash area.  You have fluid, blood, or pus coming from the affected area.  You have a fever.  You have a rash over a large area of your body.  You have a rash on your eyes, mouth, or genitals.  Your rash does not improve after a few days. Get help right away if:  Your face swells or your eyes swell shut.  You have trouble breathing.  You have trouble  swallowing. This information is not intended to replace advice given to you by your health care provider. Make sure you discuss any questions you have with your health care provider. Document Released: 10/18/2000 Document Revised: 03/28/2016 Document Reviewed: 03/29/2015  2017 Elsevier

## 2016-09-24 NOTE — Progress Notes (Signed)
Pre visit review using our clinic review tool, if applicable. No additional management support is needed unless otherwise documented below in the visit note. 

## 2016-09-24 NOTE — Telephone Encounter (Signed)
Pt would like for you to call him in something for poison ivy on R arm elbow to wrist and wife has Dx.   Pharm:  [facility]  Also let the pt know that he may need to come in to be Dx by Dr. Elease Hashimoto.

## 2016-09-24 NOTE — Telephone Encounter (Signed)
Pt is aware to come in at 4:00.

## 2016-09-27 DIAGNOSIS — L237 Allergic contact dermatitis due to plants, except food: Secondary | ICD-10-CM | POA: Diagnosis not present

## 2016-10-10 DIAGNOSIS — M9901 Segmental and somatic dysfunction of cervical region: Secondary | ICD-10-CM | POA: Diagnosis not present

## 2016-10-10 DIAGNOSIS — M9902 Segmental and somatic dysfunction of thoracic region: Secondary | ICD-10-CM | POA: Diagnosis not present

## 2016-10-10 DIAGNOSIS — M791 Myalgia: Secondary | ICD-10-CM | POA: Diagnosis not present

## 2016-10-10 DIAGNOSIS — M5032 Other cervical disc degeneration, mid-cervical region, unspecified level: Secondary | ICD-10-CM | POA: Diagnosis not present

## 2016-11-28 DIAGNOSIS — M791 Myalgia: Secondary | ICD-10-CM | POA: Diagnosis not present

## 2016-11-28 DIAGNOSIS — M9901 Segmental and somatic dysfunction of cervical region: Secondary | ICD-10-CM | POA: Diagnosis not present

## 2016-11-28 DIAGNOSIS — M5032 Other cervical disc degeneration, mid-cervical region, unspecified level: Secondary | ICD-10-CM | POA: Diagnosis not present

## 2016-11-28 DIAGNOSIS — M9902 Segmental and somatic dysfunction of thoracic region: Secondary | ICD-10-CM | POA: Diagnosis not present

## 2016-12-30 ENCOUNTER — Telehealth: Payer: Self-pay | Admitting: Family Medicine

## 2016-12-30 DIAGNOSIS — R7989 Other specified abnormal findings of blood chemistry: Secondary | ICD-10-CM

## 2016-12-30 NOTE — Telephone Encounter (Signed)
OK to add total testosterone but we need to make sure he gets labs before/ or by 9 AM

## 2016-12-30 NOTE — Telephone Encounter (Signed)
Order entered in chart. Pt is aware via voicemail.

## 2016-12-30 NOTE — Telephone Encounter (Signed)
Pending OV for CPE 01-14-2017 Pending lab appt 01-07-2017  Please advise on adding lab

## 2016-12-30 NOTE — Telephone Encounter (Signed)
Pt would like his testosterone levels added to his [facility] labs done next week.

## 2017-01-07 ENCOUNTER — Other Ambulatory Visit (INDEPENDENT_AMBULATORY_CARE_PROVIDER_SITE_OTHER): Payer: BLUE CROSS/BLUE SHIELD

## 2017-01-07 DIAGNOSIS — E349 Endocrine disorder, unspecified: Secondary | ICD-10-CM | POA: Diagnosis not present

## 2017-01-07 DIAGNOSIS — Z0000 Encounter for general adult medical examination without abnormal findings: Secondary | ICD-10-CM

## 2017-01-07 DIAGNOSIS — R7989 Other specified abnormal findings of blood chemistry: Secondary | ICD-10-CM

## 2017-01-07 LAB — CBC WITH DIFFERENTIAL/PLATELET
BASOS PCT: 0.2 % (ref 0.0–3.0)
Basophils Absolute: 0 10*3/uL (ref 0.0–0.1)
EOS PCT: 3.3 % (ref 0.0–5.0)
Eosinophils Absolute: 0.2 10*3/uL (ref 0.0–0.7)
HCT: 42.8 % (ref 39.0–52.0)
Hemoglobin: 14.6 g/dL (ref 13.0–17.0)
Lymphocytes Relative: 31.1 % (ref 12.0–46.0)
Lymphs Abs: 1.9 10*3/uL (ref 0.7–4.0)
MCHC: 34.1 g/dL (ref 30.0–36.0)
MCV: 91.1 fl (ref 78.0–100.0)
MONOS PCT: 9.3 % (ref 3.0–12.0)
Monocytes Absolute: 0.6 10*3/uL (ref 0.1–1.0)
Neutro Abs: 3.4 10*3/uL (ref 1.4–7.7)
Neutrophils Relative %: 56.1 % (ref 43.0–77.0)
Platelets: 229 10*3/uL (ref 150.0–400.0)
RBC: 4.7 Mil/uL (ref 4.22–5.81)
RDW: 12.8 % (ref 11.5–15.5)
WBC: 6.1 10*3/uL (ref 4.0–10.5)

## 2017-01-07 LAB — BASIC METABOLIC PANEL
BUN: 12 mg/dL (ref 6–23)
CO2: 28 mEq/L (ref 19–32)
Calcium: 9.6 mg/dL (ref 8.4–10.5)
Chloride: 103 mEq/L (ref 96–112)
Creatinine, Ser: 0.91 mg/dL (ref 0.40–1.50)
GFR: 91.54 mL/min (ref >60.00)
GLUCOSE: 106 mg/dL — AB (ref 70–99)
POTASSIUM: 4.9 meq/L (ref 3.5–5.1)
SODIUM: 139 meq/L (ref 135–145)

## 2017-01-07 LAB — HEPATIC FUNCTION PANEL
ALBUMIN: 4.5 g/dL (ref 3.5–5.2)
ALT: 19 U/L (ref 0–53)
AST: 15 U/L (ref 0–37)
Alkaline Phosphatase: 52 U/L (ref 39–117)
Bilirubin, Direct: 0.1 mg/dL (ref 0.0–0.3)
TOTAL PROTEIN: 6.8 g/dL (ref 6.0–8.3)
Total Bilirubin: 0.7 mg/dL (ref 0.2–1.2)

## 2017-01-07 LAB — LIPID PANEL
Cholesterol: 221 mg/dL — ABNORMAL HIGH (ref 0–200)
HDL: 63.5 mg/dL (ref >39.00)
LDL Cholesterol: 133 mg/dL — ABNORMAL HIGH (ref 0–99)
NonHDL: 157.04
Total CHOL/HDL Ratio: 3
Triglycerides: 119 mg/dL (ref 0.0–149.0)
VLDL: 23.8 mg/dL (ref 0.0–40.0)

## 2017-01-07 LAB — TESTOSTERONE: Testosterone: 296.89 ng/dL — ABNORMAL LOW (ref 300.00–890.00)

## 2017-01-07 LAB — TSH: TSH: 1.87 u[IU]/mL (ref 0.35–4.50)

## 2017-01-07 LAB — PSA: PSA: 1.06 ng/mL (ref 0.10–4.00)

## 2017-01-14 ENCOUNTER — Encounter: Payer: BLUE CROSS/BLUE SHIELD | Admitting: Family Medicine

## 2017-01-14 DIAGNOSIS — M9901 Segmental and somatic dysfunction of cervical region: Secondary | ICD-10-CM | POA: Diagnosis not present

## 2017-01-14 DIAGNOSIS — M9902 Segmental and somatic dysfunction of thoracic region: Secondary | ICD-10-CM | POA: Diagnosis not present

## 2017-01-14 DIAGNOSIS — M5032 Other cervical disc degeneration, mid-cervical region, unspecified level: Secondary | ICD-10-CM | POA: Diagnosis not present

## 2017-01-14 DIAGNOSIS — M791 Myalgia: Secondary | ICD-10-CM | POA: Diagnosis not present

## 2017-01-17 ENCOUNTER — Encounter: Payer: Self-pay | Admitting: Family Medicine

## 2017-01-17 ENCOUNTER — Ambulatory Visit (INDEPENDENT_AMBULATORY_CARE_PROVIDER_SITE_OTHER): Payer: BLUE CROSS/BLUE SHIELD | Admitting: Family Medicine

## 2017-01-17 VITALS — BP 120/88 | HR 58 | Temp 98.4°F | Ht 69.5 in | Wt 216.9 lb

## 2017-01-17 DIAGNOSIS — Z23 Encounter for immunization: Secondary | ICD-10-CM | POA: Diagnosis not present

## 2017-01-17 DIAGNOSIS — Z0000 Encounter for general adult medical examination without abnormal findings: Secondary | ICD-10-CM

## 2017-01-17 NOTE — Progress Notes (Signed)
Subjective:     Patient ID: Joshua Ellison, male   DOB: Dec 12, 1960, 56 y.o.   MRN: [id number]  HPI Patient seen for physical exam. Currently takes no regular medications. He previously was on testosterone replacement but did not feel any improvement symptom wise when he was on that. Last tetanus is unknown. He apparently is due for repeat colonoscopy this September. Polyp on last screening 3 years ago. Generally feels well. He recently made some dietary changes and has lost some weight due to his efforts. He has good appetite.  Patient states that he smoked over 30 pack year history starting around age 68 and quit sometime in his early 39s. He does not have any cough or other symptoms at this time. He had seen some recent advertisement regarding low-dose CT lung cancer screening and has some interest in that.  Past Medical History:  Diagnosis Date  . SUPRAPUBIC PAIN 02/14/2009   Past Surgical History:  Procedure Laterality Date  . INSERTION OF MESH N/A 10/20/2014   Procedure: INSERTION OF MESH;  Surgeon: Coralie Keens, MD;  Location: [facility];  Service: General;  Laterality: N/A;  . KNEE ARTHROSCOPY W/ ACL RECONSTRUCTION Left 1995  . TONSILLECTOMY  1970  . UMBILICAL HERNIA REPAIR N/A 10/20/2014   Procedure: OPEN UMBILICAL HERNIA REPAIR WITH MESH;  Surgeon: Coralie Keens, MD;  Location: [facility];  Service: General;  Laterality: N/A;  . VASECTOMY  2007    reports that he quit smoking about 13 years ago. He has never used smokeless tobacco. He reports that he drinks about 8.4 oz of alcohol per week . He reports that he does not use drugs. family history includes COPD in his paternal grandmother; Diabetes in his father; Hyperlipidemia in his mother. No Known Allergies   Review of Systems  Constitutional: Negative for activity change, appetite change, fatigue and fever.  HENT: Negative for congestion, ear pain and trouble swallowing.   Eyes: Negative for pain and visual disturbance.   Respiratory: Negative for cough, shortness of breath and wheezing.   Cardiovascular: Negative for chest pain and palpitations.  Gastrointestinal: Negative for abdominal distention, abdominal pain, blood in stool, constipation, diarrhea, nausea, rectal pain and vomiting.  Genitourinary: Negative for dysuria, hematuria and testicular pain.  Musculoskeletal: Negative for arthralgias and joint swelling.  Skin: Negative for rash.  Neurological: Negative for dizziness, syncope and headaches.  Hematological: Negative for adenopathy.  Psychiatric/Behavioral: Negative for confusion and dysphoric mood.       Objective:   Physical Exam  Constitutional: He is oriented to person, place, and time. He appears well-developed and well-nourished. No distress.  HENT:  Head: Normocephalic and atraumatic.  Right Ear: External ear normal.  Left Ear: External ear normal.  Mouth/Throat: Oropharynx is clear and moist.  Eyes: Conjunctivae and EOM are normal. Pupils are equal, round, and reactive to light.  Neck: Normal range of motion. Neck supple. No thyromegaly present.  Cardiovascular: Normal rate, regular rhythm and normal heart sounds.   No murmur heard. Pulmonary/Chest: No respiratory distress. He has no wheezes. He has no rales.  Abdominal: Soft. Bowel sounds are normal. He exhibits no distension and no mass. There is no tenderness. There is no rebound and no guarding.  Musculoskeletal: He exhibits no edema.  Lymphadenopathy:    He has no cervical adenopathy.  Neurological: He is alert and oriented to person, place, and time. He displays normal reflexes. No cranial nerve deficit.  Skin: No rash noted.  Psychiatric: He has a normal mood and affect.  Assessment:     Physical exam. Labs reviewed. He has elevated lipids but 10 year risk of CAD event 4.97%    Plan:     -Continue weight loss efforts -We discussed low-dose CT lung cancer screening and he has some definite interest -Tetanus  booster given -low saturated fat diet.  Eulas Post MD [facility] at [facility]

## 2017-01-17 NOTE — Patient Instructions (Signed)
We will set up low dose CT lung cancer screen Continue with weight loss efforts.

## 2017-01-17 NOTE — Progress Notes (Signed)
Pre visit review using our clinic review tool, if applicable. No additional management support is needed unless otherwise documented below in the visit note. 

## 2017-01-25 IMAGING — CR DG RIBS W/ CHEST 3+V*R*
5 series · 5 of 5 positions shown · non-contrast
Comparison: Chest 10/09/2007

CLINICAL DATA: Posterior right rib pain. Fell down with known
stairs x2 hours.

EXAM:
RIGHT RIBS AND CHEST - 3+ VIEW

[chest pa]
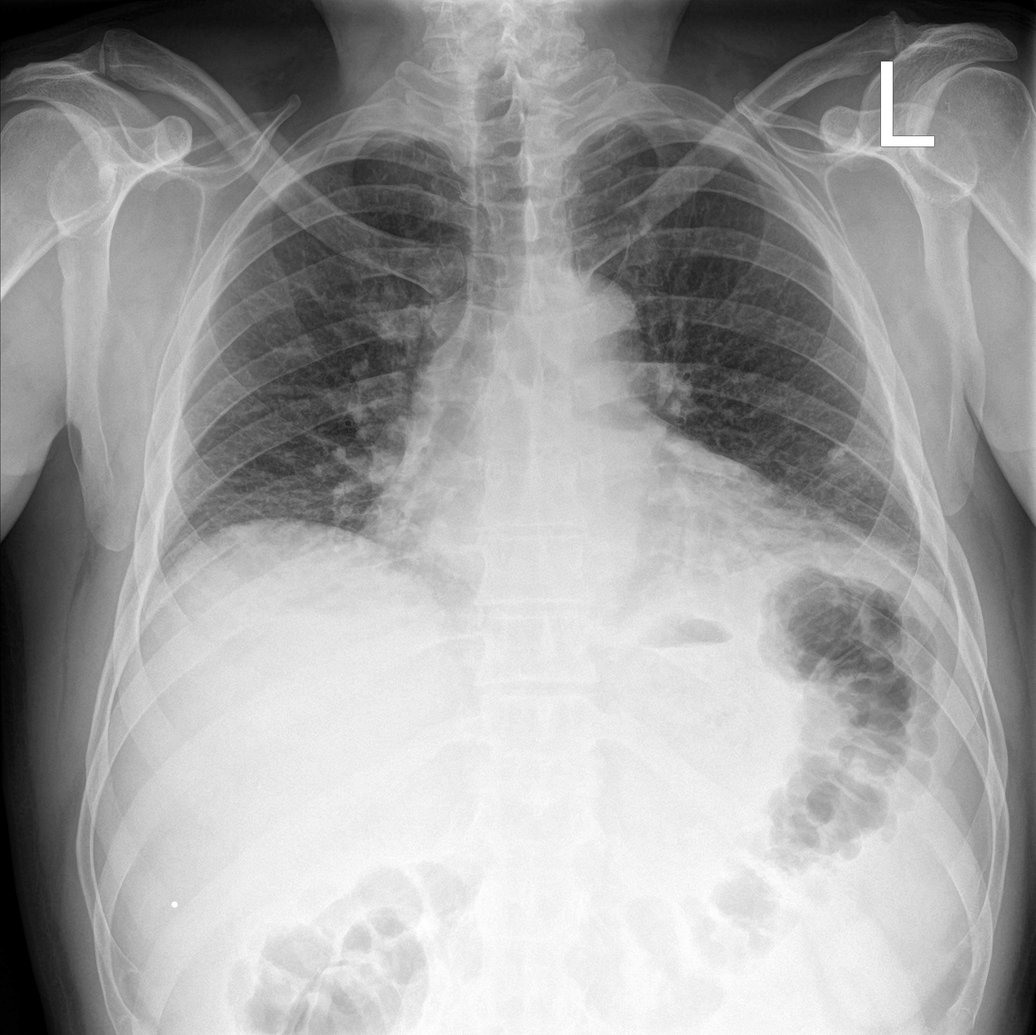

[rib ap (1 of 2)]
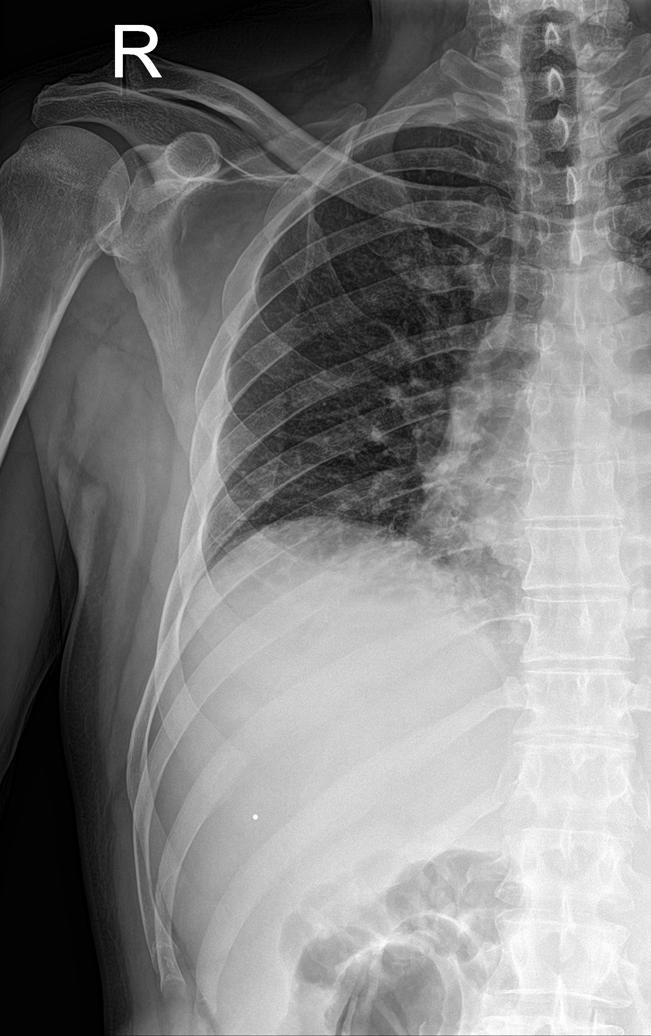

[rib ap obl (1 of 2)]
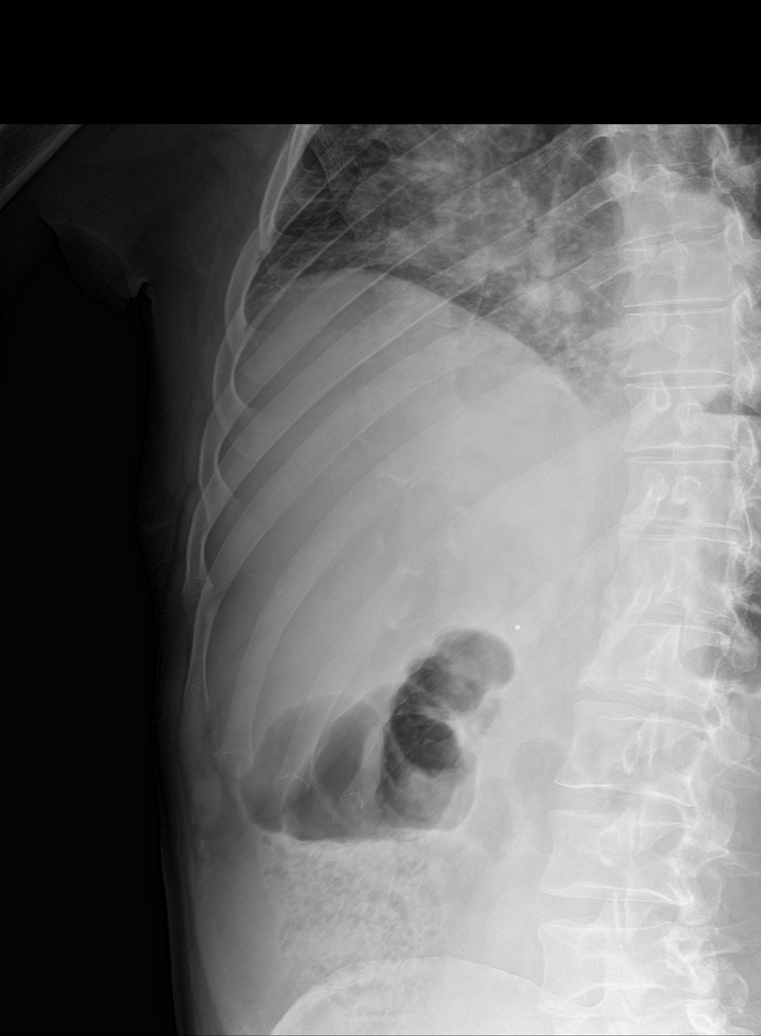

[rib ap (2 of 2)]
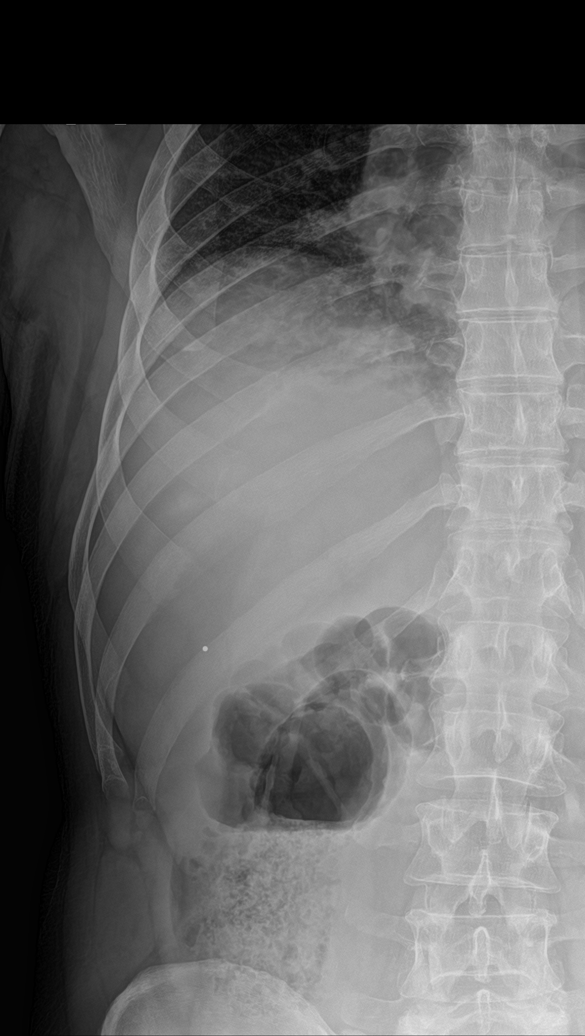

[rib ap obl (2 of 2)]
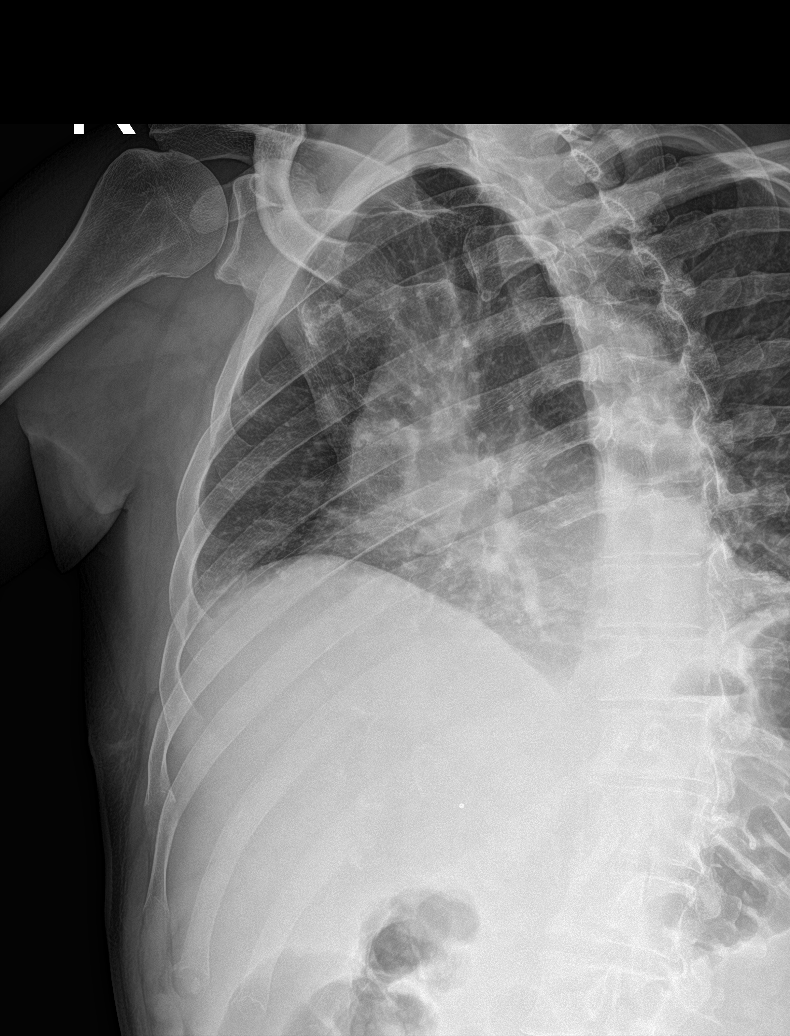

[5 of 5 positions shown; findings below may reference images not displayed]

FINDINGS: Shallow inspiration with linear atelectasis in the lung bases.
Normal heart size and pulmonary vascularity. Calcified granuloma in
the left mid lung. No focal consolidation. No blunting of
costophrenic angles. No pneumothorax. Mediastinal contours appear
intact.

Right ribs appear intact. No acute displaced fractures are
identified. No focal bone lesion or bone destruction.
IMPRESSION: Shallow inspiration with linear atelectasis in the lung bases.
Negative right ribs.

## 2017-01-25 IMAGING — CR DG ELBOW COMPLETE 3+V*R*
4 series · 4 of 4 positions shown · non-contrast
Comparison: None.

CLINICAL DATA: Patient fell down within stairs 2 hours ago. Right
elbow pain.

EXAM:
RIGHT ELBOW - COMPLETE 3+ VIEW

[elbow ap]
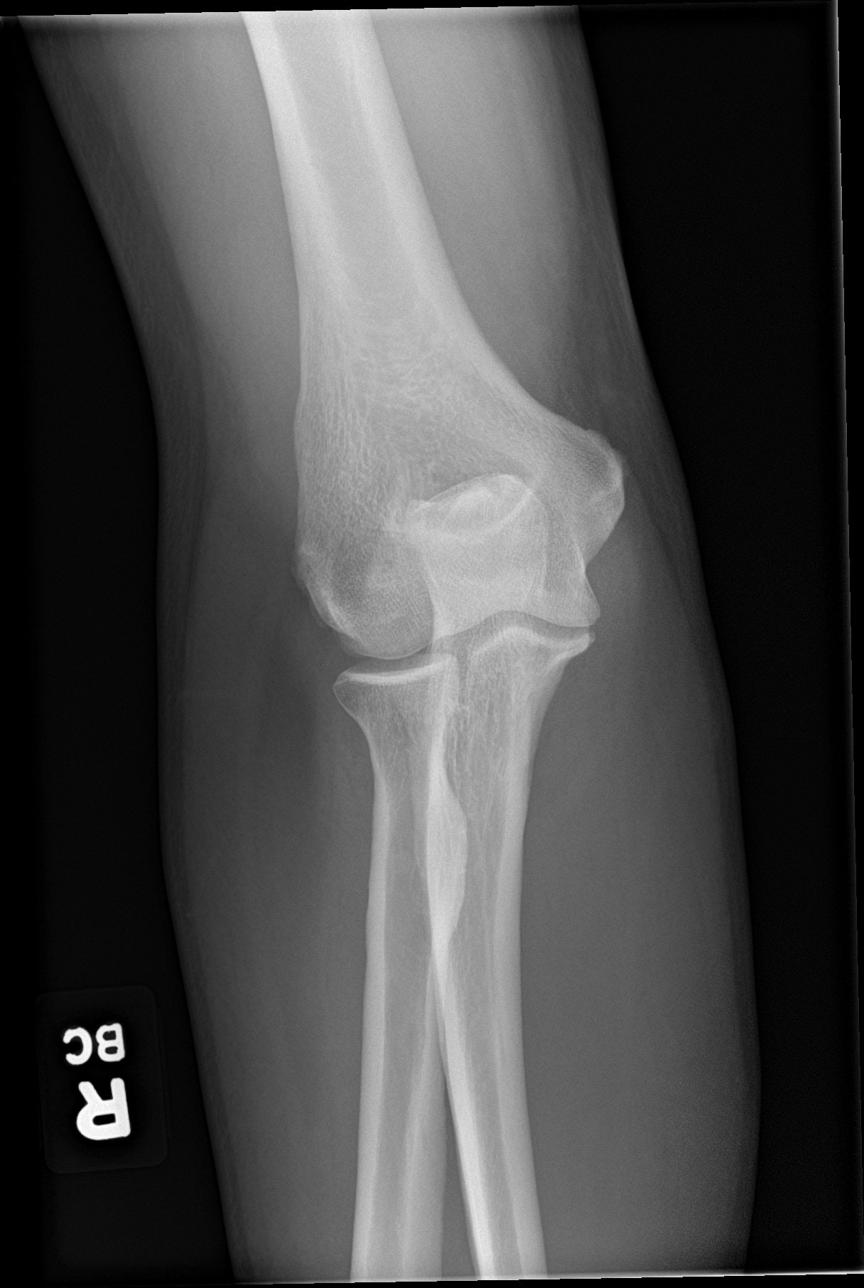

[elbow obl (1 of 2)]
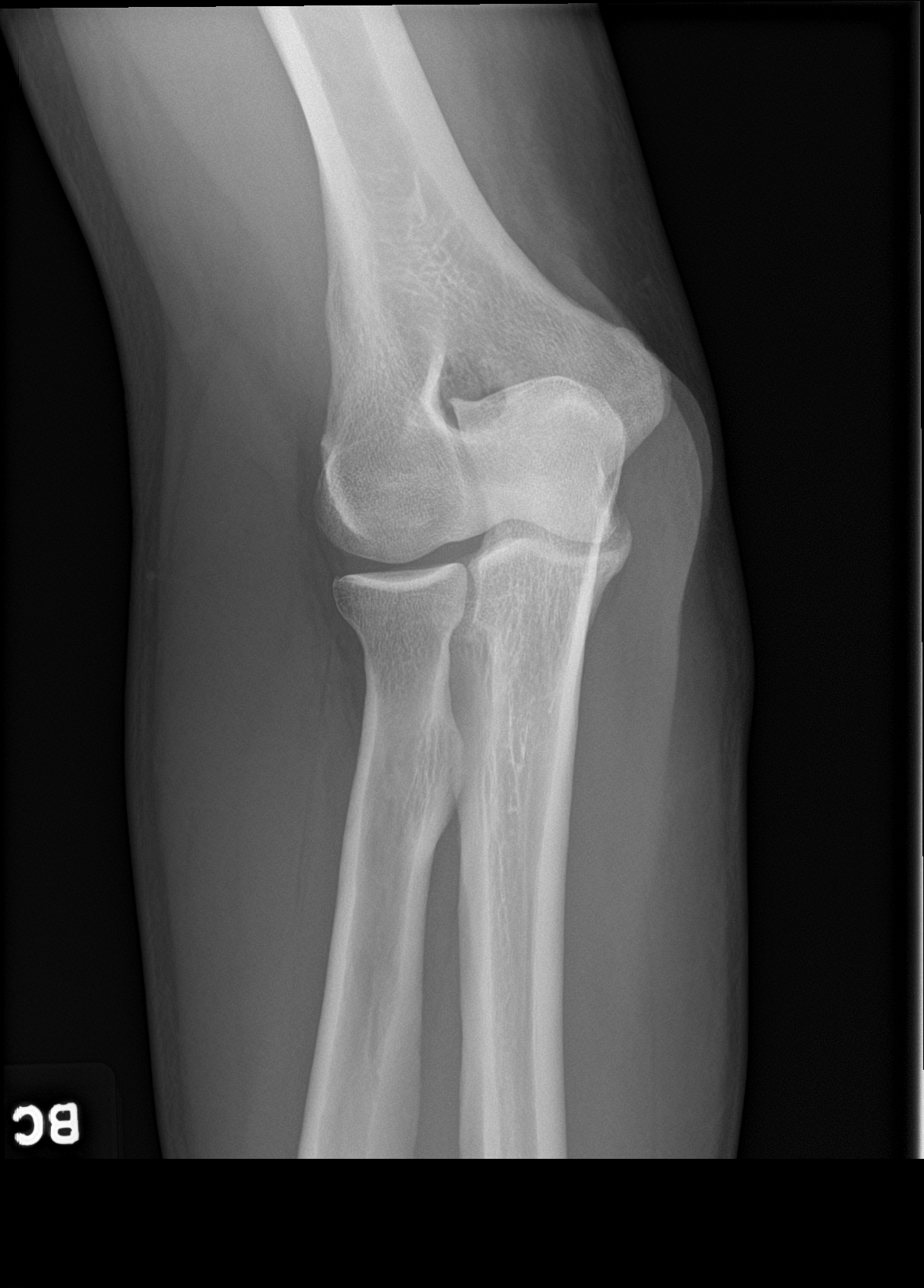

[elbow obl (2 of 2)]
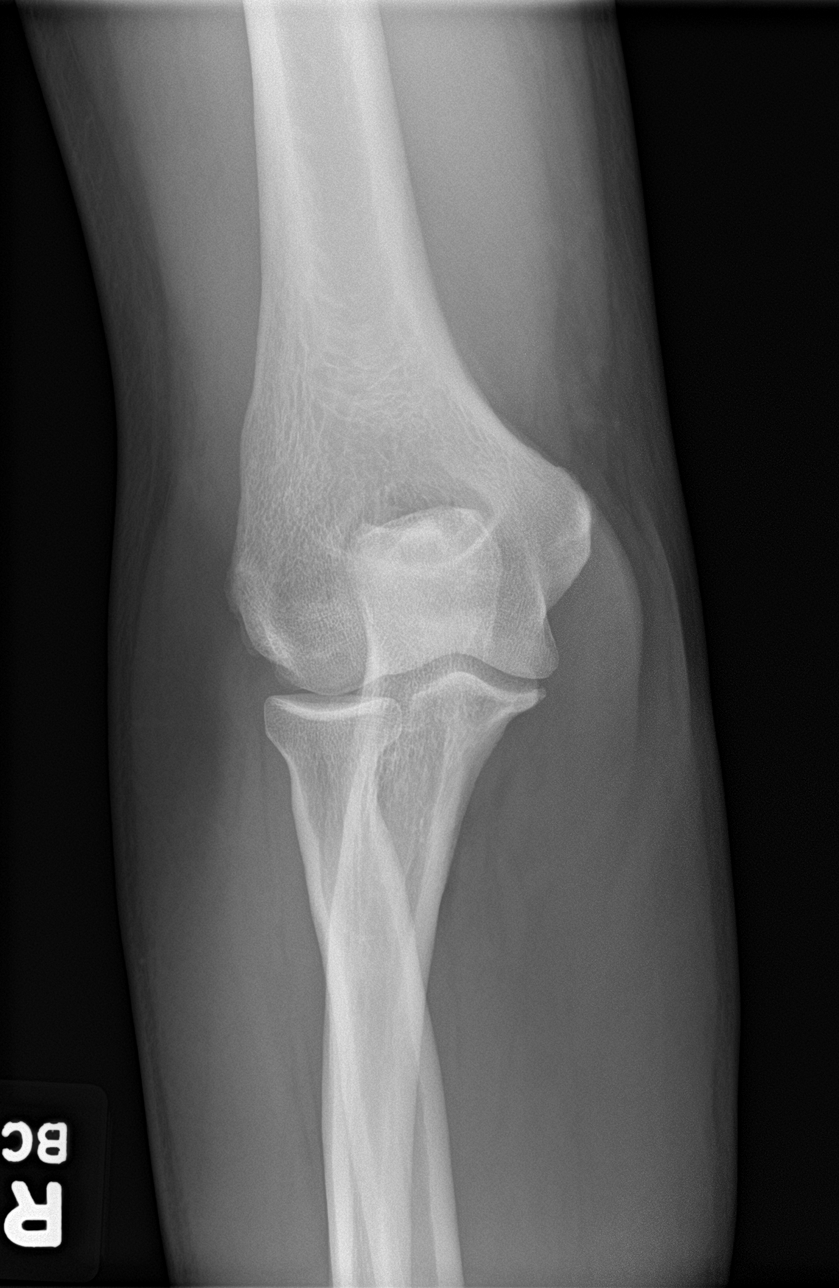

[elbow lat]
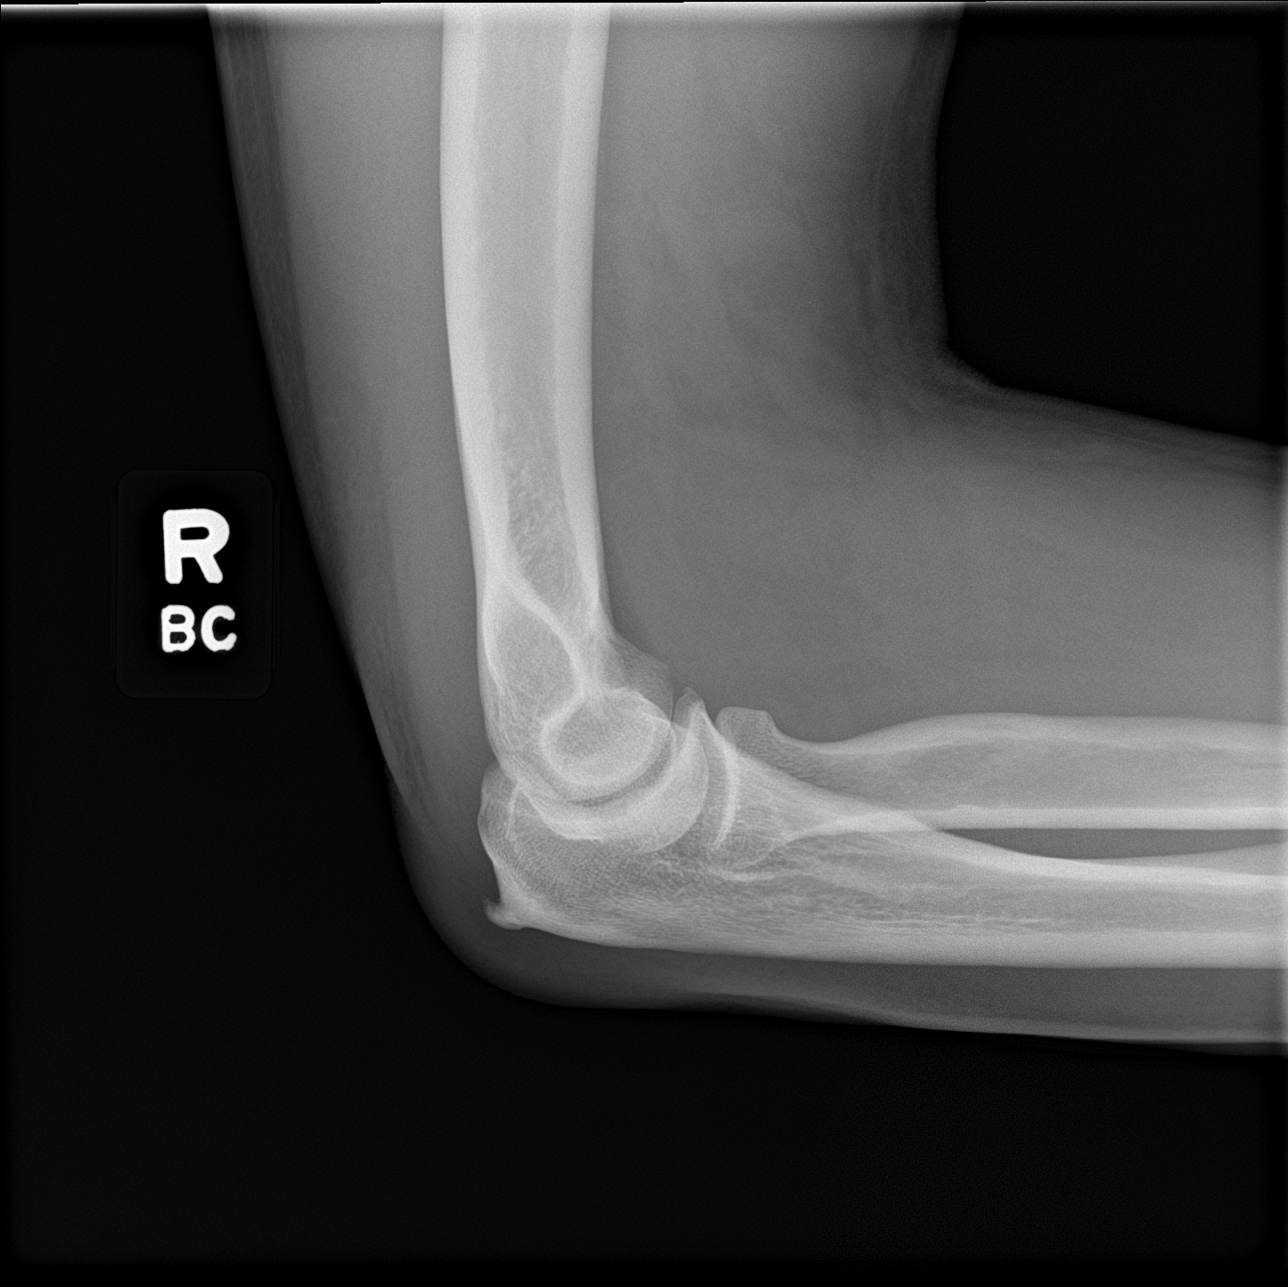

[4 of 4 positions shown; findings below may reference images not displayed]

FINDINGS: Mild degenerative spurring in the right elbow. Small olecranon spur.
No evidence of acute fracture or dislocation. No focal bone lesion
or bone destruction. No significant effusion. Soft tissues are
unremarkable.
IMPRESSION: Mild degenerative changes.  No acute fracture or dislocation.

## 2017-01-26 NOTE — Addendum Note (Signed)
Addended by: Eulas Post on: 01/26/2017 11:30 AM   Modules accepted: Orders

## 2017-02-12 ENCOUNTER — Other Ambulatory Visit: Payer: Self-pay | Admitting: Acute Care

## 2017-02-12 DIAGNOSIS — Z87891 Personal history of nicotine dependence: Secondary | ICD-10-CM

## 2017-02-17 ENCOUNTER — Telehealth: Payer: Self-pay | Admitting: Acute Care

## 2017-02-18 NOTE — Telephone Encounter (Signed)
Will forward to the lung screening pool 

## 2017-02-19 NOTE — Telephone Encounter (Signed)
Spoke with pt and rescheduled [facility] for 03/07/17 3:00. Pt verbalized understanding. Nothing further needed

## 2017-02-24 ENCOUNTER — Ambulatory Visit: Payer: BLUE CROSS/BLUE SHIELD

## 2017-02-24 ENCOUNTER — Encounter: Payer: BLUE CROSS/BLUE SHIELD | Admitting: Acute Care

## 2017-02-25 ENCOUNTER — Ambulatory Visit: Payer: BLUE CROSS/BLUE SHIELD | Admitting: Family Medicine

## 2017-02-27 ENCOUNTER — Telehealth: Payer: Self-pay | Admitting: Acute Care

## 2017-02-28 NOTE — Telephone Encounter (Signed)
Will forward to the lung screening pool 

## 2017-03-03 NOTE — Telephone Encounter (Signed)
Routing to lung cancer screening pool.  

## 2017-03-03 NOTE — Telephone Encounter (Signed)
Pt calling to resched lung screening appoint, please advise pt can be reached @ [phone] .[place]illery [place]

## 2017-03-03 NOTE — Telephone Encounter (Signed)
LMTC x 1  

## 2017-03-03 NOTE — Telephone Encounter (Signed)
Spoke with pt and rescheduled for [facility] 03/17/17 3:30 CT will be rescheduled Nothing further needed

## 2017-03-04 ENCOUNTER — Ambulatory Visit (INDEPENDENT_AMBULATORY_CARE_PROVIDER_SITE_OTHER): Payer: BLUE CROSS/BLUE SHIELD | Admitting: Family Medicine

## 2017-03-04 ENCOUNTER — Encounter: Payer: Self-pay | Admitting: Family Medicine

## 2017-03-04 VITALS — BP 110/70 | HR 64 | Temp 98.4°F | Wt 218.8 lb

## 2017-03-04 DIAGNOSIS — J358 Other chronic diseases of tonsils and adenoids: Secondary | ICD-10-CM | POA: Diagnosis not present

## 2017-03-04 NOTE — Patient Instructions (Signed)
Follow up for any persistent sore throat or persistent neck adenopathy.

## 2017-03-04 NOTE — Progress Notes (Signed)
Pre visit review using our clinic review tool, if applicable. No additional management support is needed unless otherwise documented below in the visit note. 

## 2017-03-04 NOTE — Progress Notes (Signed)
Subjective:     Patient ID: Joshua Ellison, male   DOB: 1961/04/07, 56 y.o.   MRN: [id number]  HPI   Patient for follow-up exam. Recent physical. He had some somewhat prominent inflamed looking tissue in the left tonsillar region. No sore throat. No appetite or weight changes. No neck adenopathy. No difficulty swallowing. Past smoker. He is in process of getting low-dose CT lung cancer screening. No recent cough.  Past Medical History:  Diagnosis Date  . SUPRAPUBIC PAIN 02/14/2009   Past Surgical History:  Procedure Laterality Date  . INSERTION OF MESH N/A 10/20/2014   Procedure: INSERTION OF MESH;  Surgeon: Coralie Keens, MD;  Location: [place];  Service: General;  Laterality: N/A;  . KNEE ARTHROSCOPY W/ ACL RECONSTRUCTION Left 1995  . TONSILLECTOMY  1970  . UMBILICAL HERNIA REPAIR N/A 10/20/2014   Procedure: OPEN UMBILICAL HERNIA REPAIR WITH MESH;  Surgeon: Coralie Keens, MD;  Location: [place];  Service: General;  Laterality: N/A;  . VASECTOMY  2007    reports that he quit smoking about 13 years ago. He has never used smokeless tobacco. He reports that he drinks about 8.4 oz of alcohol per week . He reports that he does not use drugs. family history includes COPD in his paternal grandmother; Diabetes in his father; Hyperlipidemia in his mother. No Known Allergies   Review of Systems  Constitutional: Negative for appetite change and unexpected weight change.  HENT: Negative for sore throat and trouble swallowing.   Hematological: Negative for adenopathy.       Objective:   Physical Exam  Constitutional: He appears well-developed and well-nourished.  HENT:  Mouth/Throat: Oropharynx is clear and moist.  Neck: Neck supple.  Cardiovascular: Normal rate and regular rhythm.   Pulmonary/Chest: Effort normal and breath sounds normal. No respiratory distress. He has no wheezes. He has no rales.  Lymphadenopathy:    He has no cervical adenopathy.       Assessment:     Patient  with benign-appearing residual tonsillar posterior pharynx. He does not have any red flags such as appetite change, weight loss, adenopathy, or persistent sore throat    Plan:     -Follow-up for any new concerns. -Proceed with low-dose CT lung cancer screening  Eulas Post MD [facility] at [facility]

## 2017-03-07 ENCOUNTER — Ambulatory Visit: Payer: BLUE CROSS/BLUE SHIELD

## 2017-03-07 ENCOUNTER — Encounter: Payer: BLUE CROSS/BLUE SHIELD | Admitting: Acute Care

## 2017-03-11 ENCOUNTER — Ambulatory Visit: Payer: BLUE CROSS/BLUE SHIELD | Admitting: Family Medicine

## 2017-03-14 ENCOUNTER — Ambulatory Visit: Payer: BLUE CROSS/BLUE SHIELD

## 2017-03-14 ENCOUNTER — Encounter: Payer: BLUE CROSS/BLUE SHIELD | Admitting: Acute Care

## 2017-03-17 ENCOUNTER — Encounter: Payer: Self-pay | Admitting: Acute Care

## 2017-03-17 ENCOUNTER — Ambulatory Visit (INDEPENDENT_AMBULATORY_CARE_PROVIDER_SITE_OTHER): Payer: BLUE CROSS/BLUE SHIELD | Admitting: Acute Care

## 2017-03-17 ENCOUNTER — Ambulatory Visit (INDEPENDENT_AMBULATORY_CARE_PROVIDER_SITE_OTHER)
Admission: RE | Admit: 2017-03-17 | Discharge: 2017-03-17 | Disposition: A | Discharge status: Home / Self Care | Payer: BLUE CROSS/BLUE SHIELD | Source: Ambulatory Visit | Attending: Acute Care | Admitting: Acute Care

## 2017-03-17 DIAGNOSIS — Z87891 Personal history of nicotine dependence: Secondary | ICD-10-CM

## 2017-03-17 DIAGNOSIS — F17210 Nicotine dependence, cigarettes, uncomplicated: Secondary | ICD-10-CM | POA: Diagnosis not present

## 2017-03-17 DIAGNOSIS — L237 Allergic contact dermatitis due to plants, except food: Secondary | ICD-10-CM | POA: Diagnosis not present

## 2017-03-17 NOTE — Progress Notes (Signed)
Shared Decision Making Visit Lung Cancer Screening Program [phone])   Eligibility:  Age 56 y.o.  Pack Years Smoking History Calculation 54-pack-year smoking history (# packs/per year x # years smoked)  Recent History of coughing up blood  no  Unexplained weight loss? no ( >Than 15 pounds within the last 6 months )  Prior History Lung / other cancer no (Diagnosis within the last 5 years already requiring surveillance chest CT Scans).  Smoking Status Former Smoker  Former Smokers: Years since quit: 13 years  Quit Date: 11/2003  Visit Components:  Discussion included one or more decision making aids. yes  Discussion included risk/benefits of screening. yes  Discussion included potential follow up diagnostic testing for abnormal scans. yes  Discussion included meaning and risk of over diagnosis. yes  Discussion included meaning and risk of False Positives. yes  Discussion included meaning of total radiation exposure. yes  Counseling Included:  Importance of adherence to annual lung cancer LDCT screening. yes  Impact of comorbidities on ability to participate in the program. yes  Ability and willingness to under diagnostic treatment. yes  Smoking Cessation Counseling:  Current Smokers:   Discussed importance of smoking cessation. Not applicable  Information about tobacco cessation classes and interventions provided to patient. yes  Patient provided with "ticket" for LDCT Scan. yes  Symptomatic Patient. no  Counseling  Diagnosis Code: Tobacco Use Z72.0  Asymptomatic Patient yes  Counseling (Intermediate counseling: > three minutes counseling) G9211  Former Smokers:   Discussed the importance of maintaining cigarette abstinence. yes  Diagnosis Code: Personal History of Nicotine Dependence. H41.740  Information about tobacco cessation classes and interventions provided to patient. Yes  Patient provided with "ticket" for LDCT Scan. yes  Written Order for  Lung Cancer Screening with LDCT placed in Epic. Yes (CT Chest Lung Cancer Screening Low Dose W/O CM) [id number] Z12.2-Screening of respiratory organs Z87.891-Personal history of nicotine dependence  I spent 25 minutes of face to face time with Joshua Ellison discussing the risks and benefits of lung cancer screening. We viewed a power point together that explained in detail the above noted topics. We took the time to pause the power point at intervals to allow for questions to be asked and answered to ensure understanding. We discussed that he had taken the single most powerful action possible to decrease his risk of developing lung cancer when he quit smoking. I counseled Joshua Ellison to remain smoke free, and to contact me if he ever had the desire to smoke again so that I can provide resources and tools to help support the effort to remain smoke free. We discussed the time and location of the scan, and that either  Doroteo Glassman RN or I will call with the results within  24-48 hours of receiving them. Joshua Ellison has my card and contact information in the event he needs to speak with me, in addition to a copy of the power point we reviewed as a resource. Joshua Ellison verbalized understanding of all of the above and had no further questions upon leaving the office.     I explained to the patient that there has been a high incidence of coronary artery disease noted on these exams. I explained that this is a non-gated exam therefore degree or severity cannot be determined. This patient is currently not on statin therapy. I have asked the patient to follow-up with their PCP regarding any incidental finding of coronary artery disease and management with diet or medication as they  feel is clinically indicated. The patient verbalized understanding of the above and had no further questions.     Magdalen Spatz, NP 03/17/2017

## 2017-03-24 ENCOUNTER — Other Ambulatory Visit: Payer: Self-pay | Admitting: Acute Care

## 2017-03-24 DIAGNOSIS — Z87891 Personal history of nicotine dependence: Secondary | ICD-10-CM

## 2017-07-24 ENCOUNTER — Encounter: Payer: Self-pay | Admitting: Family Medicine

## 2017-08-08 ENCOUNTER — Encounter: Payer: Self-pay | Admitting: Gastroenterology

## 2017-08-26 ENCOUNTER — Encounter: Payer: Self-pay | Admitting: Gastroenterology

## 2017-10-16 ENCOUNTER — Other Ambulatory Visit: Payer: Self-pay

## 2017-10-16 ENCOUNTER — Ambulatory Visit (AMBULATORY_SURGERY_CENTER): Payer: Self-pay | Admitting: *Deleted

## 2017-10-16 VITALS — Ht 70.0 in | Wt 223.0 lb

## 2017-10-16 DIAGNOSIS — Z86010 Personal history of colonic polyps: Secondary | ICD-10-CM

## 2017-10-16 MED ORDER — PEG-KCL-NACL-NASULF-NA ASC-C 140 G PO SOLR: 1.0000 | ORAL | 0 refills | Status: DC

## 2017-10-16 NOTE — Progress Notes (Signed)
No egg or soy allergy known to patient  No issues with past sedation with any surgeries  or procedures, no intubation problems  No diet pills per patient No home 02 use per patient  No blood thinners per patient  Pt denies issues with constipation  No A fib or A flutter  EMMI video sent to pt's e mail - pt declined  Called pt's pharmacy- they state [facility] is a Hydrologist co pay

## 2017-10-30 ENCOUNTER — Encounter: Payer: BLUE CROSS/BLUE SHIELD | Admitting: Gastroenterology

## 2017-12-15 ENCOUNTER — Encounter: Payer: BLUE CROSS/BLUE SHIELD | Admitting: Gastroenterology

## 2018-02-03 ENCOUNTER — Ambulatory Visit (AMBULATORY_SURGERY_CENTER): Payer: Self-pay | Admitting: *Deleted

## 2018-02-03 ENCOUNTER — Other Ambulatory Visit: Payer: Self-pay

## 2018-02-03 VITALS — Ht 70.0 in | Wt 222.0 lb

## 2018-02-03 DIAGNOSIS — Z86010 Personal history of colonic polyps: Secondary | ICD-10-CM

## 2018-02-03 MED ORDER — PEG-KCL-NACL-NASULF-NA ASC-C 140 G PO SOLR
1.0000 | Freq: Once | ORAL | 0 refills | Status: AC
Start: 2018-02-03 — End: 2018-02-03

## 2018-02-03 NOTE — Progress Notes (Signed)
Patient denies any allergies to eggs or soy. Patient denies any problems with anesthesia/sedation. Patient denies any oxygen use at home. Patient denies taking any diet/weight loss medications or blood thinners. EMMI education assisgned to patient on colonoscopy, this was explained and instructions given to patient. 

## 2018-02-06 ENCOUNTER — Encounter: Payer: Self-pay | Admitting: Gastroenterology

## 2018-02-17 ENCOUNTER — Encounter: Payer: Self-pay | Admitting: Gastroenterology

## 2018-02-17 ENCOUNTER — Ambulatory Visit (AMBULATORY_SURGERY_CENTER): Payer: BLUE CROSS/BLUE SHIELD | Admitting: Gastroenterology

## 2018-02-17 ENCOUNTER — Other Ambulatory Visit: Payer: Self-pay

## 2018-02-17 VITALS — BP 111/76 | HR 58 | Temp 98.6°F | Resp 14 | Ht 70.0 in | Wt 222.0 lb

## 2018-02-17 DIAGNOSIS — D123 Benign neoplasm of transverse colon: Secondary | ICD-10-CM | POA: Diagnosis not present

## 2018-02-17 DIAGNOSIS — Z86010 Personal history of colonic polyps: Secondary | ICD-10-CM | POA: Diagnosis present

## 2018-02-17 MED ORDER — SODIUM CHLORIDE 0.9 % IV SOLN: 500.0000 mL | Freq: Once | INTRAVENOUS | Status: DC

## 2018-02-17 NOTE — Progress Notes (Signed)
Called to room to assist during endoscopic procedure.  Patient ID and intended procedure confirmed with present staff. Received instructions for my participation in the procedure from the performing physician.  

## 2018-02-17 NOTE — Op Note (Signed)
Joshua Patient Name: Joshua Ellison Procedure Date: 02/17/2018 8:38 AM MRN: [id number] Endoscopist: Joshua Ellison , MD Age: 57 Referring MD:  Date of Birth: 1961/03/30 Gender: Male Account #: [id number] Procedure:                Colonoscopy Indications:              High risk colon cancer surveillance: Personal                            history of adenoma (10 mm or greater in size) Medicines:                Monitored Anesthesia Care Procedure:                Pre-Anesthesia Assessment:                           - Prior to the procedure, a History and Physical                            was performed, and patient medications and                            allergies were reviewed. The patient's tolerance of                            previous anesthesia was also reviewed. The risks                            and benefits of the procedure and the sedation                            options and risks were discussed with the patient.                            All questions were answered, and informed consent                            was obtained. Prior Anticoagulants: The patient has                            taken no previous anticoagulant or antiplatelet                            agents. ASA Grade Assessment: II - A patient with                            mild systemic disease. After reviewing the risks                            and benefits, the patient was deemed in                            satisfactory condition to undergo the procedure.  After obtaining informed consent, the colonoscope                            was passed under direct vision. Throughout the                            procedure, the patient's blood pressure, pulse, and                            oxygen saturations were monitored continuously. The                            Model PCF-[id number]) scope was introduced                            through the anus  and advanced to the the cecum,                            identified by appendiceal orifice and ileocecal                            valve. The colonoscopy was performed without                            difficulty. The patient tolerated the procedure                            well. The quality of the bowel preparation was                            excellent. The ileocecal valve, appendiceal                            orifice, and rectum were photographed. Scope In: 8:50:28 AM Scope Out: 9:14:01 AM Scope Withdrawal Time: 0 hours 18 minutes 50 seconds  Total Procedure Duration: 0 hours 23 minutes 33 seconds  Findings:                 The perianal and digital rectal examinations were                            normal.                           Two sessile polyps were found in the transverse                            colon. The polyps were 2 to 4 mm in size. These                            polyps were removed with a cold biopsy forceps.                            Resection and retrieval were complete.  A 10 mm polyp was found in the transverse colon.                            The polyp was sessile. The polyp was removed with a                            cold snare. Resection and retrieval were complete. Complications:            No immediate complications. Estimated Blood Loss:     Estimated blood loss was minimal. Impression:               - Two 2 to 4 mm polyps in the transverse colon,                            removed with a cold biopsy forceps. Resected and                            retrieved.                           - One 10 mm polyp in the transverse colon, removed                            with a cold snare. Resected and retrieved. Recommendation:           - Patient has a contact number available for                            emergencies. The signs and symptoms of potential                            delayed complications were discussed with the                             patient. Return to normal activities tomorrow.                            Written discharge instructions were provided to the                            patient.                           - Resume previous diet.                           - Continue present medications.                           - Await pathology results.                           - Repeat colonoscopy in 3 years for surveillance  based on pathology results. Joshua Pole, MD 02/17/2018 9:18:53 AM This report has been signed electronically.

## 2018-02-17 NOTE — Patient Instructions (Addendum)
YOU HAD AN ENDOSCOPIC PROCEDURE TODAY AT [facility]:   Refer to the procedure report that was given to you for any specific questions about what was found during the examination.  If the procedure report does not answer your questions, please call your gastroenterologist to clarify.  If you requested that your care partner not be given the details of your procedure findings, then the procedure report has been included in a sealed envelope for you to review at your convenience later.  YOU SHOULD EXPECT: Some feelings of bloating in the abdomen. Passage of more gas than usual.  Walking can help get rid of the air that was put into your GI tract during the procedure and reduce the bloating. If you had a lower endoscopy (such as a colonoscopy or flexible sigmoidoscopy) you may notice spotting of blood in your stool or on the toilet paper. If you underwent a bowel prep for your procedure, you may not have a normal bowel movement for a few days.  Please Note:  You might notice some irritation and congestion in your nose or some drainage.  This is from the oxygen used during your procedure.  There is no need for concern and it should clear up in a day or so.  SYMPTOMS TO REPORT IMMEDIATELY:   Following lower endoscopy (colonoscopy or flexible sigmoidoscopy):  Excessive amounts of blood in the stool  Significant tenderness or worsening of abdominal pains  Swelling of the abdomen that is new, acute  Fever of 100F or higher  For urgent or emergent issues, a gastroenterologist can be reached at any hour by calling [phone].   DIET:  We do recommend a small meal at first, but then you may proceed to your regular diet.  Drink plenty of fluids but you should avoid alcoholic beverages for 24 hours.  ACTIVITY:  You should plan to take it easy for the rest of today and you should NOT DRIVE or use heavy machinery until tomorrow (because of the sedation medicines used during the test).     FOLLOW UP: Our staff will call the number listed on your records the next business day following your procedure to check on you and address any questions or concerns that you may have regarding the information given to you following your procedure. If we do not reach you, we will leave a message.  However, if you are feeling well and you are not experiencing any problems, there is no need to return our call.  We will assume that you have returned to your regular daily activities without incident.  If any biopsies were taken you will be contacted by phone or by letter within the next 1-3 weeks.  Please call us at [phone] if you have not heard about the biopsies in 3 weeks.    Await for biopsy results to determine next repeat Colonoscopy Polyps (handout given) Diverticulosis (handout given) Hemorrhoids (handout given) Hemorrhoid Banding (handout given)  SIGNATURES/CONFIDENTIALITY: You and/or your care partner have signed paperwork which will be entered into your electronic medical record.  These signatures attest to the fact that that the information above on your After Visit Summary has been reviewed and is understood.  Full responsibility of the confidentiality of this discharge information lies with you and/or your care-partner.

## 2018-02-17 NOTE — Progress Notes (Signed)
Report to PACU, RN, vss, BBS= Clear.  

## 2018-02-18 ENCOUNTER — Telehealth: Payer: Self-pay | Admitting: *Deleted

## 2018-02-18 NOTE — Telephone Encounter (Signed)
  Follow up Call-  Call back number 02/17/2018  Post procedure Call Back phone  # [phone]  Permission to leave phone message Yes  Some recent data might be hidden     Patient questions:  Do you have a fever, pain , or abdominal swelling? No. Pain Score  0 *  Have you tolerated food without any problems? Yes.    Have you been able to return to your normal activities? Yes.    Do you have any questions about your discharge instructions: Diet   No. Medications  No. Follow up visit  No.  Do you have questions or concerns about your Care? No.  Actions: * If pain score is 4 or above: No action needed, pain <4.

## 2018-02-18 NOTE — Telephone Encounter (Signed)
No answer. Number identifier. Message left to call if questions or concerns and we will attempt to call later in the day. 

## 2018-02-25 ENCOUNTER — Encounter: Payer: Self-pay | Admitting: Gastroenterology

## 2018-03-18 ENCOUNTER — Ambulatory Visit (INDEPENDENT_AMBULATORY_CARE_PROVIDER_SITE_OTHER)
Admission: RE | Admit: 2018-03-18 | Discharge: 2018-03-18 | Disposition: A | Discharge status: Home / Self Care | Payer: BLUE CROSS/BLUE SHIELD | Source: Ambulatory Visit | Attending: Acute Care | Admitting: Acute Care

## 2018-03-18 DIAGNOSIS — Z87891 Personal history of nicotine dependence: Secondary | ICD-10-CM

## 2018-04-02 ENCOUNTER — Other Ambulatory Visit: Payer: Self-pay | Admitting: Acute Care

## 2018-04-02 DIAGNOSIS — Z122 Encounter for screening for malignant neoplasm of respiratory organs: Secondary | ICD-10-CM

## 2018-04-02 DIAGNOSIS — Z87891 Personal history of nicotine dependence: Secondary | ICD-10-CM

## 2018-05-12 ENCOUNTER — Ambulatory Visit: Payer: BLUE CROSS/BLUE SHIELD | Admitting: Family Medicine

## 2018-05-12 ENCOUNTER — Encounter: Payer: Self-pay | Admitting: Family Medicine

## 2018-05-12 VITALS — BP 122/64 | HR 71 | Temp 98.2°F | Wt 223.4 lb

## 2018-05-12 DIAGNOSIS — J209 Acute bronchitis, unspecified: Secondary | ICD-10-CM | POA: Diagnosis not present

## 2018-05-12 NOTE — Patient Instructions (Signed)

## 2018-05-12 NOTE — Progress Notes (Signed)
  Subjective:     Patient ID: Joshua Ellison, male   DOB: 1961/03/13, 57 y.o.   MRN: [id number]  HPI Patient seen for acute visit. He and his wife just returned from being overseas in [place] for about 3 weeks. There were on a 2 week cruise and also in [place] for about a week. 3 weeks ago patient developed sore throat. Sore throat symptoms now resolved.  He then developed some nasal congestion and cough. Cough initially dry and productive and now relatively dry. No fever. No dyspnea. No hemoptysis. No appetite or weight changes.  Past Medical History:  Diagnosis Date  . SUPRAPUBIC PAIN 02/14/2009   Past Surgical History:  Procedure Laterality Date  . COLONOSCOPY    . INSERTION OF MESH N/A 10/20/2014   Procedure: INSERTION OF MESH;  Surgeon: Coralie Keens, MD;  Location: [place];  Service: General;  Laterality: N/A;  . KNEE ARTHROSCOPY W/ ACL RECONSTRUCTION Right 1995  . POLYPECTOMY    . TONSILLECTOMY  1970  . UMBILICAL HERNIA REPAIR N/A 10/20/2014   Procedure: OPEN UMBILICAL HERNIA REPAIR WITH MESH;  Surgeon: Coralie Keens, MD;  Location: [place];  Service: General;  Laterality: N/A;  . VASECTOMY  2007    reports that he quit smoking about 14 years ago. He has a 54.00 pack-year smoking history. He has never used smokeless tobacco. He reports that he drinks about 8.4 oz of alcohol per week. He reports that he does not use drugs. family history includes Bladder Cancer in his father; COPD in his paternal grandmother; Diabetes in his father; Hyperlipidemia in his mother; Prostate cancer in his paternal grandfather; Stomach cancer in his maternal grandmother. No Known Allergies   Review of Systems  Constitutional: Negative for appetite change, chills, fever and unexpected weight change.  HENT: Negative for sinus pain and sore throat.   Respiratory: Positive for cough. Negative for shortness of breath and wheezing.   Cardiovascular: Negative for chest pain.       Objective:   Physical Exam   Constitutional: He appears well-developed and well-nourished.  HENT:  Right Ear: Tympanic membrane normal.  Left Ear: Tympanic membrane normal.  Mouth/Throat: Oropharynx is clear and moist.  Neck: Normal range of motion. Neck supple.  Cardiovascular: Normal rate and regular rhythm.  Pulmonary/Chest: Effort normal and breath sounds normal. No respiratory distress. He has no wheezes. He has no rales.  Lymphadenopathy:    He has no cervical adenopathy.       Assessment:     Cough. Suspect acute viral bronchitis. Nonfocal exam.    Plan:     -Consider over-the-counter mucolytic such as plain Mucinex -Plenty of fluids and rest. Follow-up promptly for any fever, shortness of breath, or if cough not resolving over the next couple weeks  Eulas Post MD [facility]

## 2018-08-15 ENCOUNTER — Emergency Department (HOSPITAL_COMMUNITY): Payer: BLUE CROSS/BLUE SHIELD

## 2018-08-15 ENCOUNTER — Emergency Department (HOSPITAL_COMMUNITY)
Admission: EM | Admit: 2018-08-15 | Discharge: 2018-08-15 | Disposition: A | Discharge status: Home / Self Care | Payer: BLUE CROSS/BLUE SHIELD | Attending: Emergency Medicine | Admitting: Emergency Medicine

## 2018-08-15 ENCOUNTER — Encounter (HOSPITAL_COMMUNITY): Payer: Self-pay | Admitting: Emergency Medicine

## 2018-08-15 DIAGNOSIS — Y929 Unspecified place or not applicable: Secondary | ICD-10-CM | POA: Diagnosis not present

## 2018-08-15 DIAGNOSIS — W25XXXA Contact with sharp glass, initial encounter: Secondary | ICD-10-CM | POA: Diagnosis not present

## 2018-08-15 DIAGNOSIS — Y939 Activity, unspecified: Secondary | ICD-10-CM | POA: Diagnosis not present

## 2018-08-15 DIAGNOSIS — Z87891 Personal history of nicotine dependence: Secondary | ICD-10-CM | POA: Diagnosis not present

## 2018-08-15 DIAGNOSIS — Y999 Unspecified external cause status: Secondary | ICD-10-CM | POA: Insufficient documentation

## 2018-08-15 DIAGNOSIS — S8992XA Unspecified injury of left lower leg, initial encounter: Secondary | ICD-10-CM | POA: Diagnosis not present

## 2018-08-15 DIAGNOSIS — S81812A Laceration without foreign body, left lower leg, initial encounter: Secondary | ICD-10-CM | POA: Insufficient documentation

## 2018-08-15 DIAGNOSIS — S81821A Laceration with foreign body, right lower leg, initial encounter: Secondary | ICD-10-CM | POA: Diagnosis not present

## 2018-08-15 DIAGNOSIS — T148XXA Other injury of unspecified body region, initial encounter: Secondary | ICD-10-CM

## 2018-08-15 DIAGNOSIS — W19XXXA Unspecified fall, initial encounter: Secondary | ICD-10-CM

## 2018-08-15 DIAGNOSIS — M795 Residual foreign body in soft tissue: Secondary | ICD-10-CM

## 2018-08-15 MED ORDER — OXYCODONE-ACETAMINOPHEN 5-325 MG PO TABS
1.0000 | ORAL_TABLET | Freq: Once | ORAL | Status: AC
  Administered 2018-08-15: 1 via ORAL
  Filled 2018-08-15: qty 1

## 2018-08-15 MED ORDER — LIDOCAINE-EPINEPHRINE (PF) 2 %-1:200000 IJ SOLN
20.0000 mL | Freq: Once | INTRAMUSCULAR | Status: AC
  Administered 2018-08-15: 20 mL via INTRADERMAL
  Filled 2018-08-15: qty 20

## 2018-08-15 MED ORDER — CEPHALEXIN 500 MG PO CAPS: 500.0000 mg | ORAL_CAPSULE | Freq: Four times a day (QID) | ORAL | 0 refills | Status: AC

## 2018-08-15 NOTE — ED Provider Notes (Signed)
[facility] EMERGENCY DEPARTMENT Provider Note   CSN: [id number] Arrival date & time: 08/15/18  1616     History   Chief Complaint Chief Complaint  Patient presents with  . Laceration    HPI Joshua Ellison is a 58 y.o. male presents for evaluation of acute onset, persistent wound to the right lower extremity secondary to mechanical fall.  He states that just prior to arrival he was carrying a glass patio tabletop which shattered in his hands.  He then slipped on the glass fragments landing on his buttocks.  He sustained wounds to the right upper extremity near the elbow and lateral aspect of the right calf.  He endorses sharp pains with palpation and attempts to ambulate.  Denies numbness or tingling.  He denies back pain.  No head injury or loss of consciousness, denies headache currently.  Received Percocet in the ED with some improvement in pain.  Tetanus is up-to-date.  The history is provided by the patient.    Past Medical History:  Diagnosis Date  . SUPRAPUBIC PAIN 02/14/2009    Patient Active Problem List   Diagnosis Date Noted  . Low testosterone 02/09/2014  . Obesity (BMI 30-39.9) 12/23/2013  . LUMBAGO 10/08/2010  . SORE THROAT 02/09/2010  . PENILE PAIN 11/29/2009  . ABDOMINAL PAIN 02/14/2009  . SUPRAPUBIC PAIN 02/14/2009    Past Surgical History:  Procedure Laterality Date  . COLONOSCOPY    . INSERTION OF MESH N/A 10/20/2014   Procedure: INSERTION OF MESH;  Surgeon: Coralie Keens, MD;  Location: [facility];  Service: General;  Laterality: N/A;  . KNEE ARTHROSCOPY W/ ACL RECONSTRUCTION Right 1995  . POLYPECTOMY    . TONSILLECTOMY  1970  . UMBILICAL HERNIA REPAIR N/A 10/20/2014   Procedure: OPEN UMBILICAL HERNIA REPAIR WITH MESH;  Surgeon: Coralie Keens, MD;  Location: [facility];  Service: General;  Laterality: N/A;  . VASECTOMY  2007        Home Medications    Prior to Admission medications   Medication Sig Start Date End Date Taking?  Authorizing Provider  cephALEXin (KEFLEX) 500 MG capsule Take 1 capsule (500 mg total) by mouth 4 (four) times daily for 5 days. 08/15/18 08/20/18  Rodell Perna A, PA-C  ibuprofen (ADVIL,MOTRIN) 200 MG tablet Take 600 mg by mouth every 6 (six) hours as needed. For pain    [provider]  Multiple Vitamin (MULTIVITAMIN) tablet Take 1 tablet by mouth daily.    [provider]  Omega-3 Fatty Acids (FISH OIL) 1000 MG CAPS Take by mouth daily.    [provider]    Family History Family History  Problem Relation Age of Onset  . COPD Paternal Grandmother   . Hyperlipidemia Mother   . Diabetes Father   . Bladder Cancer Father   . Stomach cancer Maternal Grandmother   . Prostate cancer Paternal Grandfather   . Colon cancer Neg Hx   . Colon polyps Neg Hx   . Esophageal cancer Neg Hx   . Rectal cancer Neg Hx   . Pancreatic cancer Neg Hx   . Breast cancer Neg Hx     Social History Social History   Tobacco Use  . Smoking status: Former Smoker    Packs/day: 2.00    Years: 27.00    Pack years: 54.00    Last attempt to quit: 11/05/2003    Years since quitting: 14.7  . Smokeless tobacco: Never Used  Substance Use Topics  . Alcohol use:  Yes    Alcohol/week: 14.0 standard drinks    Types: 14 Cans of beer per week    Comment: 2 beers per day   . Drug use: No     Allergies   Patient has no known allergies.   Review of Systems Review of Systems  Musculoskeletal: Negative for back pain.  Skin: Positive for wound.  Neurological: Negative for syncope, weakness and numbness.  All other systems reviewed and are negative.    Physical Exam Updated Vital Signs BP 105/76   Pulse 78   Temp 98.4 F (36.9 C)   Resp 16   SpO2 96%   Physical Exam  Constitutional: He appears well-developed and well-nourished. No distress.  HENT:  Head: Normocephalic and atraumatic.  Eyes: Conjunctivae are normal. Right eye exhibits no discharge. Left eye exhibits no  discharge.  Neck: No JVD present. No tracheal deviation present.  Cardiovascular: Normal rate and intact distal pulses.  2+ radial and DP/PT pulses bilaterally  Pulmonary/Chest: Effort normal.  Abdominal: He exhibits no distension.  Musculoskeletal: He exhibits no edema.  No midline spine TTP, no paraspinal muscle tenderness, no deformity, crepitus, or step-off noted.  5/5 strength of BUE and BLE major muscle groups.  Neurological: He is alert.  Fluent speech with no evidence of dysarthria or aphasia, no facial droop, sensation intact to soft touch of bilateral upper and lower extremities  Skin: Skin is warm and dry. No erythema.  Superficial abrasions noted to the right elbow.  Superficial abrasions noted to the distal right lower extremity.  2 large linear lacerations noted to the lateral aspect of the right calf.  Difficult to visualize initially due to small arterial bleed.  After bleed was controlled, visualized a 7 cm linear laceration to the lateral aspect of the right calf and inferior to this there is a 12 cm linear laceration which is deeper with exposed muscle and fascia.  No foreign bodies noted.  1 cm V-shaped skin avulsion noted inferiorly to this as well as 3 cm superficial laceration.  Bleeding controlled.  Psychiatric: He has a normal mood and affect. His behavior is normal.  Nursing note and vitals reviewed.    ED Treatments / Results  Labs (all labs ordered are listed, but only abnormal results are displayed) Labs Reviewed - No data to display  EKG None  Radiology Dg Tibia/fibula Right  Result Date: 08/15/2018 CLINICAL DATA:  Glass laceration laterally in the right lower extremity EXAM: RIGHT TIBIA AND FIBULA - 2 VIEW COMPARISON:  None. FINDINGS: No osseous fracture or suspicious focal osseous lesion. Surgical hardware from prior ACL graft repair in the right knee. Solitary 3 mm superficial radiopaque foreign body in lateral soft tissues at the level of the  proximal fibular shaft. IMPRESSION: Solitary 3 mm superficial radiopaque foreign body in the lateral soft tissues at the level of the proximal fibular shaft. No acute osseous abnormality. Electronically Signed   By: Ilona Sorrel M.D.   On: 08/15/2018 17:05    Procedures .Marland KitchenLaceration Repair Date/Time: 08/15/2018 11:52 PM Performed by: Renita Papa, PA-C Authorized by: Renita Papa, PA-C   Consent:    Consent obtained:  Verbal   Consent given by:  Patient   Risks discussed:  Infection, need for additional repair, pain, poor cosmetic result, poor wound healing, tendon damage and vascular damage   Alternatives discussed:  No treatment and delayed treatment Universal protocol:    Procedure explained and questions answered to patient or proxy's satisfaction: yes  Relevant documents present and verified: yes     Test results available and properly labeled: yes     Imaging studies available: yes     Required blood products, implants, devices, and special equipment available: yes     Patient identity confirmed:  Verbally with patient Anesthesia (see MAR for exact dosages):    Anesthesia method:  Local infiltration   Local anesthetic:  Lidocaine 2% WITH epi Laceration details:    Location:  Leg   Leg location:  R lower leg   Length (cm):  12   Depth (mm):  6 Repair type:    Repair type:  Complex Pre-procedure details:    Preparation:  Patient was prepped and draped in usual sterile fashion and imaging obtained to evaluate for foreign bodies Exploration:    Limited defect created (wound extended): no     Hemostasis achieved with:  Tied off vessels and direct pressure   Wound exploration: wound explored through full range of motion and entire depth of wound probed and visualized     Wound extent: areolar tissue violated and muscle damage     Wound extent: no underlying fracture noted     Contaminated: yes   Treatment:    Area cleansed with:  Shur-Clens, Betadine and saline   Amount  of cleaning:  Extensive   Irrigation solution:  Sterile saline   Irrigation volume:  2 L   Irrigation method:  Pressure wash   Visualized foreign bodies/material removed: no     Debridement:  Moderate   Undermining:  Minimal Subcutaneous repair:    Suture size:  4-0   Suture material:  Plain gut   Suture technique:  Simple interrupted   Number of sutures:  8 Skin repair:    Repair method:  Sutures and Steri-Strips   Suture size:  5-0   Suture material:  Prolene   Suture technique:  Simple interrupted and horizontal mattress   Number of sutures:  12 (4 horizontal mattress, 8 simple interrupted)   Number of Steri-Strips:  9 Approximation:    Approximation:  Close Post-procedure details:    Dressing:  Sterile dressing   Patient tolerance of procedure:  Tolerated well, no immediate complications .Marland KitchenLaceration Repair Date/Time: 08/16/2018 12:17 AM Performed by: Renita Papa, PA-C Authorized by: Renita Papa, PA-C   Consent:    Consent obtained:  Verbal   Consent given by:  Patient   Risks discussed:  Infection, need for additional repair, pain, poor cosmetic result and poor wound healing   Alternatives discussed:  No treatment and delayed treatment Universal protocol:    Procedure explained and questions answered to patient or proxy's satisfaction: yes     Relevant documents present and verified: yes     Test results available and properly labeled: yes     Imaging studies available: yes     Required blood products, implants, devices, and special equipment available: yes     Patient identity confirmed:  Verbally with patient Anesthesia (see MAR for exact dosages):    Anesthesia method:  Local infiltration   Local anesthetic:  Lidocaine 2% WITH epi Laceration details:    Location:  Leg   Leg location:  R lower leg   Length (cm):  7   Depth (mm):  3 Repair type:    Repair type:  Simple Pre-procedure details:    Preparation:  Patient was prepped and draped in usual sterile  fashion and imaging obtained to evaluate for foreign bodies Exploration:  Hemostasis achieved with:  Direct pressure   Wound exploration: wound explored through full range of motion and entire depth of wound probed and visualized     Wound extent: areolar tissue violated     Wound extent: no muscle damage noted and no underlying fracture noted     Contaminated: yes   Treatment:    Area cleansed with:  Saline and Betadine   Amount of cleaning:  Extensive   Irrigation solution:  Sterile saline   Irrigation volume:  2L   Irrigation method:  Pressure wash   Visualized foreign bodies/material removed: no   Skin repair:    Repair method:  Sutures and Steri-Strips   Suture size:  5-0   Suture material:  Prolene   Suture technique:  Simple interrupted   Number of sutures:  8   Number of Steri-Strips:  9 Approximation:    Approximation:  Close Post-procedure details:    Dressing:  Sterile dressing   Patient tolerance of procedure:  Tolerated well, no immediate complications .Marland KitchenLaceration Repair Date/Time: 08/16/2018 12:19 AM Performed by: Renita Papa, PA-C Authorized by: Renita Papa, PA-C   Consent:    Consent obtained:  Verbal   Consent given by:  Patient   Risks discussed:  Infection, need for additional repair, pain, poor cosmetic result and poor wound healing   Alternatives discussed:  No treatment and delayed treatment Universal protocol:    Procedure explained and questions answered to patient or proxy's satisfaction: yes     Relevant documents present and verified: yes     Test results available and properly labeled: yes     Imaging studies available: yes     Required blood products, implants, devices, and special equipment available: yes     Patient identity confirmed:  Verbally with patient Anesthesia (see MAR for exact dosages):    Anesthesia method:  None Laceration details:    Location:  Leg   Leg location:  R lower leg   Length (cm):  1   Depth (mm):   1 Pre-procedure details:    Preparation:  Patient was prepped and draped in usual sterile fashion and imaging obtained to evaluate for foreign bodies Exploration:    Hemostasis achieved with:  Direct pressure   Wound exploration: wound explored through full range of motion and entire depth of wound probed and visualized     Wound extent: areolar tissue violated     Wound extent: no underlying fracture noted     Contaminated: yes   Treatment:    Area cleansed with:  Saline   Amount of cleaning:  Extensive   Irrigation solution:  Sterile saline   Irrigation method:  Pressure wash   Visualized foreign bodies/material removed: no   Skin repair:    Repair method:  Steri-Strips   Number of Steri-Strips:  2 Approximation:    Approximation:  Close Post-procedure details:    Dressing:  Sterile dressing   Patient tolerance of procedure:  Tolerated well, no immediate complications .Marland KitchenLaceration Repair Date/Time: 08/16/2018 12:22 AM Performed by: Renita Papa, PA-C Authorized by: Renita Papa, PA-C   Consent:    Consent obtained:  Verbal   Consent given by:  Patient   Risks discussed:  Infection, need for additional repair, pain, poor cosmetic result and poor wound healing   Alternatives discussed:  No treatment and delayed treatment Universal protocol:    Procedure explained and questions answered to patient or proxy's satisfaction: yes     Relevant documents present  and verified: yes     Test results available and properly labeled: yes     Imaging studies available: yes     Required blood products, implants, devices, and special equipment available: yes     Patient identity confirmed:  Verbally with patient Anesthesia (see MAR for exact dosages):    Anesthesia method:  None Laceration details:    Location:  Leg   Leg location:  R lower leg   Length (cm):  3   Depth (mm):  2 Repair type:    Repair type:  Simple Pre-procedure details:    Preparation:  Patient was prepped and  draped in usual sterile fashion and imaging obtained to evaluate for foreign bodies Exploration:    Hemostasis achieved with:  Direct pressure   Wound exploration: wound explored through full range of motion and entire depth of wound probed and visualized     Wound extent: areolar tissue violated     Wound extent: no underlying fracture noted   Treatment:    Area cleansed with:  Saline and Betadine   Amount of cleaning:  Extensive   Irrigation solution:  Sterile saline   Irrigation method:  Pressure wash   Visualized foreign bodies/material removed: no   Skin repair:    Repair method:  Steri-Strips   Number of Steri-Strips:  3 Approximation:    Approximation:  Close Post-procedure details:    Dressing:  Non-adherent dressing and sterile dressing   Patient tolerance of procedure:  Tolerated well, no immediate complications   (including critical care time)  Medications Ordered in ED Medications  oxyCODONE-acetaminophen (PERCOCET/ROXICET) 5-325 MG per tablet 1 tablet (1 tablet Oral Given 08/15/18 1627)  lidocaine-EPINEPHrine (XYLOCAINE W/EPI) 2 %-1:200000 (PF) injection 20 mL (20 mLs Intradermal Given by Other 08/15/18 2035)     Initial Impression / Assessment and Plan / ED Course  I have reviewed the triage vital signs and the nursing notes.  Pertinent labs & imaging results that were available during my care of the patient were reviewed by me and considered in my medical decision making (see chart for details).  Clinical Course as of Aug 16 22  Sat Aug 15, 3389  2950 57 year old male with deep lacerations to his right lower leg after landing on glass.  He is getting some x-rays to look for foreign body, and then a primary wound repair.   [MB]    Clinical Course User Index [MB] Hayden Rasmussen, MD   Patient presents for evaluation of deep lacerations to the right lower leg as well as superficial abrasions to the right upper extremity and right lower leg secondary to  mechanical fall after slipping on broken glass.  He is afebrile, vital signs are stable.  He is uncomfortable but nontoxic in appearance.  He is neurovascularly intact.  Radiographs were obtained which showed a solitary 3 mm superficial radiopaque foreign body in the lateral soft tissues at the level of the proximal fibular shaft but no evidence of any acute osseous abnormality.  I was unable to visualize this foreign body during his assessment despite extensive irrigation and exploration of his wounds.  He understands that there is a risk of infection due to a possible retained foreign body.  Examination of the wounds was difficult initially due to a small arterial bleed but this was tied off and hemostasis was successfully achieved. Pressure irrigation performed. Wound explored and base of wound visualized in a bloodless field without evidence of foreign body on my assessment.  Laceration  occurred < 8 hours prior to repair which was well tolerated.  His tetanus is up-to-date.  Patient has no comorbidities to effect normal wound healing. Pt discharged with antibiotics.  Discussed suture home care with patient and wife and answered questions. Pt to follow-up for wound check and suture removal in 14 days.  He understands that his wound is high risk for infection and that he should follow-up with his PCP for reevaluation of the possible retained foreign body.  Discussed indications for return to the ED sooner.  Patient and patient's wife verbalized understanding of and agreement with plan and patient is stable for discharge home at this time.  Patient was seen and evaluated by Dr. Melina Copa who agrees with assessment and plan at this time.  Final Clinical Impressions(s) / ED Diagnoses   Final diagnoses:  Laceration of multiple sites of left lower extremity, initial encounter  Abrasion  Fall, initial encounter  Foreign body (FB) in soft tissue    ED Discharge Orders         Ordered    cephALEXin (KEFLEX) 500  MG capsule  4 times daily     08/15/18 2020           Renita Papa, PA-C 08/16/18 0027    Hayden Rasmussen, MD 08/16/18 1026

## 2018-08-15 NOTE — Discharge Instructions (Signed)
1. Medications: Please take all of your antibiotics until finished!   You may develop abdominal discomfort or diarrhea from the antibiotic.  You may help offset this with probiotics which you can buy or get in yogurt. Do not eat  or take the probiotics until 2 hours after your antibiotic. Alternate 600 mg of ibuprofen and [phone] mg of Tylenol every 3 hours as needed for pain. Do not exceed 4000 mg of Tylenol daily.  Take ibuprofen with food to avoid upset stomach issues. 2. Treatment: ice for swelling, keep wound clean with warm soap and water and keep bandage dry, do not submerge in water for 24 hours 3. Follow Up: Please return in 14 days to have your stitches/staples removed or sooner if you have concerns.  You may also go to urgent care or your primary care physician.  Return to the emergency department sooner if any concerning signs or symptoms develop such as high fevers, redness, drainage of pus from the wound, or swelling.  There was a small piece of glass seen on your x-ray which we were unable to find today.  Follow-up with your primary care physician for repeat imaging and follow-up.    WOUND CARE  Keep area clean and dry for 24 hours. Do not remove bandage, if applied.  After 24 hours, remove bandage and wash wound gently with mild soap and warm water. Reapply a new bandage after cleaning wound, if directed.   Continue daily cleansing with soap and water until stitches/staples are removed.  Do not apply any ointments or creams to the wound while stitches/staples are in place, as this may cause delayed healing. Return if you experience any of the following signs of infection: Swelling, redness, pus drainage, streaking, fever >101.0 F  Return if you experience excessive bleeding that does not stop after 15-20 minutes of constant, firm pressure.

## 2018-08-15 NOTE — ED Triage Notes (Signed)
Pt presents to ED for assessment of laceration to his right calf after he slipped on broken glass and then cut himself on it too.  Bleeding controlled, but oozing.

## 2018-08-27 ENCOUNTER — Ambulatory Visit: Payer: BLUE CROSS/BLUE SHIELD | Admitting: Adult Health

## 2019-04-27 ENCOUNTER — Other Ambulatory Visit: Payer: Self-pay | Admitting: *Deleted

## 2019-04-27 DIAGNOSIS — Z87891 Personal history of nicotine dependence: Secondary | ICD-10-CM

## 2019-04-27 DIAGNOSIS — Z122 Encounter for screening for malignant neoplasm of respiratory organs: Secondary | ICD-10-CM

## 2019-06-07 ENCOUNTER — Other Ambulatory Visit: Payer: Self-pay

## 2019-06-07 ENCOUNTER — Ambulatory Visit (INDEPENDENT_AMBULATORY_CARE_PROVIDER_SITE_OTHER)
Admission: RE | Admit: 2019-06-07 | Discharge: 2019-06-07 | Disposition: A | Discharge status: Home / Self Care | Payer: BC Managed Care – PPO | Source: Ambulatory Visit | Attending: Acute Care | Admitting: Acute Care

## 2019-06-07 DIAGNOSIS — Z122 Encounter for screening for malignant neoplasm of respiratory organs: Secondary | ICD-10-CM

## 2019-06-07 DIAGNOSIS — Z87891 Personal history of nicotine dependence: Secondary | ICD-10-CM | POA: Diagnosis not present

## 2020-01-27 ENCOUNTER — Ambulatory Visit: Payer: BC Managed Care – PPO | Attending: Internal Medicine

## 2020-01-27 DIAGNOSIS — Z23 Encounter for immunization: Secondary | ICD-10-CM

## 2020-01-27 NOTE — Progress Notes (Signed)
   Covid-19 Vaccination Clinic  Name:  Kron Markunas    MRN: [id number] DOB: April 25, 1961  01/27/2020  Mr. Bonenberger was observed post Covid-19 immunization for 15 minutes without incident. He was provided with Vaccine Information Sheet and instruction to access the V-Safe system.   Mr. Busto was instructed to call 911 with any severe reactions post vaccine: Marland Kitchen Difficulty breathing  . Swelling of face and throat  . A fast heartbeat  . A bad rash all over body  . Dizziness and weakness   Immunizations Administered    Name Date Dose VIS Date Route   Pfizer COVID-19 Vaccine 01/27/2020  3:31 PM 0.3 mL 10/15/2019 Intramuscular   Manufacturer: [place]   Lot: [id number]   [place]: [id number]

## 2020-02-23 ENCOUNTER — Ambulatory Visit: Payer: BC Managed Care – PPO | Attending: Internal Medicine

## 2020-02-23 DIAGNOSIS — Z23 Encounter for immunization: Secondary | ICD-10-CM

## 2020-02-23 NOTE — Progress Notes (Signed)
   Covid-19 Vaccination Clinic  Name:  Orvid Baratta    MRN: [id number] DOB: 11-22-1960  02/23/2020  Mr. Shim was observed post Covid-19 immunization for 15 minutes without incident. He was provided with Vaccine Information Sheet and instruction to access the V-Safe system.   Mr. Sunga was instructed to call 911 with any severe reactions post vaccine: Marland Kitchen Difficulty breathing  . Swelling of face and throat  . A fast heartbeat  . A bad rash all over body  . Dizziness and weakness   Immunizations Administered    Name Date Dose VIS Date Route   Pfizer COVID-19 Vaccine 02/23/2020  3:47 PM 0.3 mL 12/29/2018 Intramuscular   Manufacturer: [place]   Lot: [id number]   [place]: [id number]

## 2020-04-24 DIAGNOSIS — L821 Other seborrheic keratosis: Secondary | ICD-10-CM | POA: Diagnosis not present

## 2020-04-24 DIAGNOSIS — D485 Neoplasm of uncertain behavior of skin: Secondary | ICD-10-CM | POA: Diagnosis not present

## 2020-04-24 DIAGNOSIS — D224 Melanocytic nevi of scalp and neck: Secondary | ICD-10-CM | POA: Diagnosis not present

## 2020-04-24 DIAGNOSIS — L989 Disorder of the skin and subcutaneous tissue, unspecified: Secondary | ICD-10-CM | POA: Diagnosis not present

## 2020-04-24 DIAGNOSIS — D2371 Other benign neoplasm of skin of right lower limb, including hip: Secondary | ICD-10-CM | POA: Diagnosis not present

## 2020-04-24 DIAGNOSIS — D225 Melanocytic nevi of trunk: Secondary | ICD-10-CM | POA: Diagnosis not present

## 2020-05-09 DIAGNOSIS — M25562 Pain in left knee: Secondary | ICD-10-CM | POA: Diagnosis not present

## 2020-06-13 DIAGNOSIS — D485 Neoplasm of uncertain behavior of skin: Secondary | ICD-10-CM | POA: Diagnosis not present

## 2020-09-12 DIAGNOSIS — M25571 Pain in right ankle and joints of right foot: Secondary | ICD-10-CM | POA: Diagnosis not present

## 2020-09-26 DIAGNOSIS — M25571 Pain in right ankle and joints of right foot: Secondary | ICD-10-CM | POA: Diagnosis not present

## 2020-10-10 DIAGNOSIS — M9903 Segmental and somatic dysfunction of lumbar region: Secondary | ICD-10-CM | POA: Diagnosis not present

## 2020-10-10 DIAGNOSIS — M9902 Segmental and somatic dysfunction of thoracic region: Secondary | ICD-10-CM | POA: Diagnosis not present

## 2020-10-10 DIAGNOSIS — M9905 Segmental and somatic dysfunction of pelvic region: Secondary | ICD-10-CM | POA: Diagnosis not present

## 2020-10-10 DIAGNOSIS — M5386 Other specified dorsopathies, lumbar region: Secondary | ICD-10-CM | POA: Diagnosis not present

## 2020-10-16 DIAGNOSIS — M9902 Segmental and somatic dysfunction of thoracic region: Secondary | ICD-10-CM | POA: Diagnosis not present

## 2020-10-16 DIAGNOSIS — M9905 Segmental and somatic dysfunction of pelvic region: Secondary | ICD-10-CM | POA: Diagnosis not present

## 2020-10-16 DIAGNOSIS — M9903 Segmental and somatic dysfunction of lumbar region: Secondary | ICD-10-CM | POA: Diagnosis not present

## 2020-10-16 DIAGNOSIS — M5386 Other specified dorsopathies, lumbar region: Secondary | ICD-10-CM | POA: Diagnosis not present

## 2020-10-30 DIAGNOSIS — M5386 Other specified dorsopathies, lumbar region: Secondary | ICD-10-CM | POA: Diagnosis not present

## 2020-10-30 DIAGNOSIS — M9903 Segmental and somatic dysfunction of lumbar region: Secondary | ICD-10-CM | POA: Diagnosis not present

## 2020-10-30 DIAGNOSIS — M9905 Segmental and somatic dysfunction of pelvic region: Secondary | ICD-10-CM | POA: Diagnosis not present

## 2020-10-30 DIAGNOSIS — M9902 Segmental and somatic dysfunction of thoracic region: Secondary | ICD-10-CM | POA: Diagnosis not present

## 2020-11-13 DIAGNOSIS — M9902 Segmental and somatic dysfunction of thoracic region: Secondary | ICD-10-CM | POA: Diagnosis not present

## 2020-11-13 DIAGNOSIS — M5386 Other specified dorsopathies, lumbar region: Secondary | ICD-10-CM | POA: Diagnosis not present

## 2020-11-13 DIAGNOSIS — M9903 Segmental and somatic dysfunction of lumbar region: Secondary | ICD-10-CM | POA: Diagnosis not present

## 2020-11-13 DIAGNOSIS — M9905 Segmental and somatic dysfunction of pelvic region: Secondary | ICD-10-CM | POA: Diagnosis not present

## 2020-11-24 ENCOUNTER — Encounter: Payer: BC Managed Care – PPO | Admitting: Family Medicine

## 2020-11-28 DIAGNOSIS — M5386 Other specified dorsopathies, lumbar region: Secondary | ICD-10-CM | POA: Diagnosis not present

## 2020-11-28 DIAGNOSIS — M9902 Segmental and somatic dysfunction of thoracic region: Secondary | ICD-10-CM | POA: Diagnosis not present

## 2020-11-28 DIAGNOSIS — M9905 Segmental and somatic dysfunction of pelvic region: Secondary | ICD-10-CM | POA: Diagnosis not present

## 2020-11-28 DIAGNOSIS — M9903 Segmental and somatic dysfunction of lumbar region: Secondary | ICD-10-CM | POA: Diagnosis not present

## 2020-12-13 DIAGNOSIS — M5386 Other specified dorsopathies, lumbar region: Secondary | ICD-10-CM | POA: Diagnosis not present

## 2020-12-13 DIAGNOSIS — M9902 Segmental and somatic dysfunction of thoracic region: Secondary | ICD-10-CM | POA: Diagnosis not present

## 2020-12-13 DIAGNOSIS — M9905 Segmental and somatic dysfunction of pelvic region: Secondary | ICD-10-CM | POA: Diagnosis not present

## 2020-12-13 DIAGNOSIS — M9903 Segmental and somatic dysfunction of lumbar region: Secondary | ICD-10-CM | POA: Diagnosis not present

## 2021-01-02 ENCOUNTER — Other Ambulatory Visit: Payer: Self-pay

## 2021-01-03 ENCOUNTER — Other Ambulatory Visit: Payer: Self-pay

## 2021-01-03 ENCOUNTER — Ambulatory Visit (INDEPENDENT_AMBULATORY_CARE_PROVIDER_SITE_OTHER): Payer: BC Managed Care – PPO | Admitting: Family Medicine

## 2021-01-03 ENCOUNTER — Encounter: Payer: Self-pay | Admitting: Family Medicine

## 2021-01-03 VITALS — BP 128/88 | HR 62 | Ht 70.0 in | Wt 219.0 lb

## 2021-01-03 DIAGNOSIS — Z23 Encounter for immunization: Secondary | ICD-10-CM | POA: Diagnosis not present

## 2021-01-03 DIAGNOSIS — Z125 Encounter for screening for malignant neoplasm of prostate: Secondary | ICD-10-CM

## 2021-01-03 DIAGNOSIS — Z0000 Encounter for general adult medical examination without abnormal findings: Secondary | ICD-10-CM

## 2021-01-03 DIAGNOSIS — Z1159 Encounter for screening for other viral diseases: Secondary | ICD-10-CM | POA: Diagnosis not present

## 2021-01-03 LAB — CBC WITH DIFFERENTIAL/PLATELET
Basophils Absolute: 0 10*3/uL (ref 0.0–0.1)
Basophils Relative: 0.2 % (ref 0.0–3.0)
Eosinophils Absolute: 0.2 10*3/uL (ref 0.0–0.7)
Eosinophils Relative: 3 % (ref 0.0–5.0)
HCT: 41.1 % (ref 39.0–52.0)
Hemoglobin: 14.3 g/dL (ref 13.0–17.0)
Lymphocytes Relative: 27.9 % (ref 12.0–46.0)
Lymphs Abs: 1.5 10*3/uL (ref 0.7–4.0)
MCHC: 34.7 g/dL (ref 30.0–36.0)
MCV: 91 fl (ref 78.0–100.0)
Monocytes Absolute: 0.5 10*3/uL (ref 0.1–1.0)
Monocytes Relative: 9.4 % (ref 3.0–12.0)
Neutro Abs: 3.3 10*3/uL (ref 1.4–7.7)
Neutrophils Relative %: 59.5 % (ref 43.0–77.0)
Platelets: 195 10*3/uL (ref 150.0–400.0)
RBC: 4.52 Mil/uL (ref 4.22–5.81)
RDW: 12.7 % (ref 11.5–15.5)
WBC: 5.5 10*3/uL (ref 4.0–10.5)

## 2021-01-03 LAB — HEPATIC FUNCTION PANEL
ALT: 20 U/L (ref 0–53)
AST: 14 U/L (ref 0–37)
Albumin: 4.5 g/dL (ref 3.5–5.2)
Alkaline Phosphatase: 56 U/L (ref 39–117)
Bilirubin, Direct: 0.1 mg/dL (ref 0.0–0.3)
Total Bilirubin: 0.7 mg/dL (ref 0.2–1.2)
Total Protein: 6.8 g/dL (ref 6.0–8.3)

## 2021-01-03 LAB — BASIC METABOLIC PANEL
BUN: 14 mg/dL (ref 6–23)
CO2: 28 mEq/L (ref 19–32)
Calcium: 9.4 mg/dL (ref 8.4–10.5)
Chloride: 101 mEq/L (ref 96–112)
Creatinine, Ser: 0.88 mg/dL (ref 0.40–1.50)
GFR: 93.69 mL/min (ref >60.00)
Glucose, Bld: 103 mg/dL — ABNORMAL HIGH (ref 70–99)
Potassium: 4.2 mEq/L (ref 3.5–5.1)
Sodium: 135 mEq/L (ref 135–145)

## 2021-01-03 LAB — LIPID PANEL
Cholesterol: 246 mg/dL — ABNORMAL HIGH (ref 0–200)
HDL: 76.1 mg/dL (ref >39.00)
LDL Cholesterol: 143 mg/dL — ABNORMAL HIGH (ref 0–99)
NonHDL: 169.92
Total CHOL/HDL Ratio: 3
Triglycerides: 137 mg/dL (ref 0.0–149.0)
VLDL: 27.4 mg/dL (ref 0.0–40.0)

## 2021-01-03 LAB — TSH: TSH: 3.35 u[IU]/mL (ref 0.35–4.50)

## 2021-01-03 LAB — PSA: PSA: 0.7 ng/mL (ref 0.10–4.00)

## 2021-01-03 MED ORDER — TADALAFIL 20 MG PO TABS: ORAL_TABLET | ORAL | 5 refills | Status: DC

## 2021-01-03 NOTE — Progress Notes (Addendum)
Established Patient Office Visit  Subjective:  Patient ID: Joshua Ellison, male    DOB: 09-25-1961  Age: 60 y.o. MRN: [id number]  CC:  Chief Complaint  Patient presents with  . Annual Exam    HPI Joshua Ellison presents for physical exam.  Generally fairly healthy.  Takes no regular medications.  He and his wife just got back from a vacation in [place].  He does relate some recent issues with erectile dysfunction.  He would like to consider trial of medication for that.  Health maintenance reviewed  -Declines flu vaccine -Tetanus due 2028 -Colonoscopy due this April -He has had COVID vaccines but is not sure of date for booster -No history of hepatitis C screening previously. -No history of shingles vaccine.  He would like to pursue that.  The 10-year ASCVD risk score Mikey Bussing DC Brooke Bonito., et al., 2013) is: 7.7%   Values used to calculate the score:     Age: 11 years     Sex: Male     Is Non-Hispanic African American: No     Diabetic: No     Tobacco smoker: No     Systolic Blood Pressure: 160 mmHg     Is BP treated: No     HDL Cholesterol: 76.1 mg/dL     Total Cholesterol: 246 mg/dL   Social history-he is married.  Has 3 children.  Ex-smoker.  Usually drinks 4-5 alcoholic beverages per day.  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  Family history-Father has type 2 diabetes.  Father had bladder cancer history.  Mother with hyperlipidemia history.  He states his paternal grandmother had dementia.  Past Medical History:  Diagnosis Date  . SUPRAPUBIC PAIN 02/14/2009    Past Surgical History:  Procedure Laterality Date  . COLONOSCOPY    . INSERTION OF MESH N/A 10/20/2014   Procedure: INSERTION OF MESH;  Surgeon: Coralie Keens, MD;  Location: [facility] [facility];  Service: General;  Laterality: N/A;  . KNEE ARTHROSCOPY W/ ACL RECONSTRUCTION Right 1995  . POLYPECTOMY    . TONSILLECTOMY  1970  . UMBILICAL HERNIA REPAIR N/A 10/20/2014   Procedure: OPEN UMBILICAL  HERNIA REPAIR WITH MESH;  Surgeon: Coralie Keens, MD;  Location: [facility];  Service: General;  Laterality: N/A;  . VASECTOMY  2007    Family History  Problem Relation Age of Onset  . COPD Paternal Grandmother   . Hyperlipidemia Mother   . Diabetes Father   . Bladder Cancer Father   . Stomach cancer Maternal Grandmother   . Prostate cancer Paternal Grandfather   . Colon cancer Neg Hx   . Colon polyps Neg Hx   . Esophageal cancer Neg Hx   . Rectal cancer Neg Hx   . Pancreatic cancer Neg Hx   . Breast cancer Neg Hx     Social History   Socioeconomic History  . Marital status: Married    Spouse name: Not on file  . Number of children: Not on file  . Years of education: Not on file  . Highest education level: Not on file  Occupational History  . Not on file  Tobacco Use  . Smoking status: Former Smoker    Packs/day: 2.00    Years: 27.00    Pack years: 54.00    Quit date: 11/05/2003    Years since quitting: 17.1  . Smokeless tobacco: Never Used  Vaping Use  . Vaping Use: Never used  Substance and Sexual Activity  . Alcohol  use: Yes    Alcohol/week: 14.0 standard drinks    Types: 14 Cans of beer per week    Comment: 2 beers per day   . Drug use: No  . Sexual activity: Not on file  Other Topics Concern  . Not on file  Social History Narrative  . Not on file   Social Determinants of Health   Financial Resource Strain: Not on file  Food Insecurity: Not on file  Transportation Needs: Not on file  Physical Activity: Not on file  Stress: Not on file  Social Connections: Not on file  Intimate Partner Violence: Not on file    Outpatient Medications Prior to Visit  Medication Sig Dispense Refill  . ibuprofen (ADVIL,MOTRIN) 200 MG tablet Take 600 mg by mouth every 6 (six) hours as needed. For pain    . Multiple Vitamin (MULTIVITAMIN) tablet Take 1 tablet by mouth daily.    . Omega-3 Fatty Acids (FISH OIL) 1000 MG CAPS Take by mouth daily.    Marland Kitchen OVER THE COUNTER  MEDICATION Nutrafol-hair growht    . 0.9 %  sodium chloride infusion      No facility-administered medications prior to visit.    No Known Allergies  ROS Review of Systems  Constitutional: Negative for activity change, appetite change, fatigue and fever.  HENT: Negative for congestion, ear pain and trouble swallowing.   Eyes: Negative for pain and visual disturbance.  Respiratory: Negative for cough, shortness of breath and wheezing.   Cardiovascular: Negative for chest pain and palpitations.  Gastrointestinal: Negative for abdominal distention, abdominal pain, blood in stool, constipation, diarrhea, nausea, rectal pain and vomiting.  Genitourinary: Negative for dysuria, hematuria and testicular pain.  Musculoskeletal: Negative for arthralgias and joint swelling.  Skin: Negative for rash.  Neurological: Negative for dizziness, syncope and headaches.  Hematological: Negative for adenopathy.  Psychiatric/Behavioral: Negative for confusion and dysphoric mood.      Objective:    Physical Exam Constitutional:      General: He is not in acute distress.    Appearance: He is well-developed and well-nourished.  HENT:     Head: Normocephalic and atraumatic.     Right Ear: External ear normal.     Left Ear: External ear normal.     Mouth/Throat:     Mouth: Oropharynx is clear and moist.  Eyes:     Extraocular Movements: EOM normal.     Conjunctiva/sclera: Conjunctivae normal.     Pupils: Pupils are equal, round, and reactive to light.  Neck:     Thyroid: No thyromegaly.  Cardiovascular:     Rate and Rhythm: Normal rate and regular rhythm.     Heart sounds: Normal heart sounds. No murmur heard.   Pulmonary:     Effort: No respiratory distress.     Breath sounds: No wheezing or rales.  Abdominal:     General: Bowel sounds are normal. There is no distension.     Palpations: Abdomen is soft. There is no mass.     Tenderness: There is no abdominal tenderness. There is no  guarding or rebound.  Musculoskeletal:        General: No edema.     Cervical back: Normal range of motion and neck supple.     Right lower leg: No edema.     Left lower leg: No edema.  Lymphadenopathy:     Cervical: No cervical adenopathy.  Skin:    Findings: No rash.  Neurological:     Mental Status: He  is alert and oriented to person, place, and time.     Cranial Nerves: No cranial nerve deficit.  Psychiatric:        Mood and Affect: Mood and affect normal.     BP 128/88   Pulse 62   Ht 5\' 10"  (1.778 m)   Wt 219 lb (99.3 kg)   SpO2 99%   BMI 31.42 kg/m  Wt Readings from Last 3 Encounters:  01/03/21 219 lb (99.3 kg)  05/12/18 223 lb 6.4 oz (101.3 kg)  02/17/18 222 lb (100.7 kg)     Health Maintenance Due  Topic Date Due  . Hepatitis C Screening  Never done  . HIV Screening  Never done  . COVID-19 Vaccine (3 - Booster for Pfizer series) 08/24/2020    There are no preventive care reminders to display for this patient.  Lab Results  Component Value Date   TSH 1.87 01/07/2017   Lab Results  Component Value Date   WBC 6.1 01/07/2017   HGB 14.6 01/07/2017   HCT 42.8 01/07/2017   MCV 91.1 01/07/2017   PLT 229.0 01/07/2017   Lab Results  Component Value Date   NA 139 01/07/2017   K 4.9 01/07/2017   CO2 28 01/07/2017   GLUCOSE 106 (H) 01/07/2017   BUN 12 01/07/2017   CREATININE 0.91 01/07/2017   BILITOT 0.7 01/07/2017   ALKPHOS 52 01/07/2017   AST 15 01/07/2017   ALT 19 01/07/2017   PROT 6.8 01/07/2017   ALBUMIN 4.5 01/07/2017   CALCIUM 9.6 01/07/2017   ANIONGAP 13 10/11/2014   GFR 91.54 01/07/2017   Lab Results  Component Value Date   CHOL 221 (H) 01/07/2017   Lab Results  Component Value Date   HDL 63.50 01/07/2017   Lab Results  Component Value Date   LDLCALC 133 (H) 01/07/2017   Lab Results  Component Value Date   TRIG 119.0 01/07/2017   Lab Results  Component Value Date   CHOLHDL 3 01/07/2017   No results found for: HGBA1C     Assessment & Plan:   Problem List Items Addressed This Visit   None   Visit Diagnoses    Physical exam    -  Primary   Relevant Orders   Basic metabolic panel   Lipid panel   CBC with Differential/Platelet   TSH   Hepatic function panel   PSA   Hep C Antibody   Varicella-zoster vaccine IM (Shingrix) (Completed)   Need for vaccination       Relevant Orders   Varicella-zoster vaccine IM (Shingrix) (Completed)    Generally healthy 60 year old male.  He had some recent issues with erectile dysfunction.  We discussed the following issues/items:  -Prescription for Cialis 20 mg 1/2 to 1 tablet every other day as needed -Recommend shingles vaccine.  He agreed to getting first today.  We reviewed potential side effects.  Reminder to get second vaccine in 2 to 6 months -Check labs as above including PSA and hepatitis C antibody -He is reminded that he needs repeat colonoscopy sometime later this spring.  He is due in April.  He will contact GI if they have not contacted him in the next month -Strongly advise he try to scale back his alcohol content to no more than 2 drinks per day -He did have questions regarding neurocognitive disorders with his family history.  We explained options of neuro psychologist assessment versus some basic screening here if he prefers to start with that  Meds ordered this encounter  Medications  . tadalafil (CIALIS) 20 MG tablet    Sig: Take one tablet by mouth every other day as needed for erectile dysfunction.    Dispense:  10 tablet    Refill:  5    Follow-up: No follow-ups on file.    Carolann Littler, MD

## 2021-01-03 NOTE — Patient Instructions (Signed)

## 2021-01-04 LAB — HEPATITIS C ANTIBODY
Hepatitis C Ab: NONREACTIVE
SIGNAL TO CUT-OFF: 0.02 (ref <1.00)

## 2021-01-10 DIAGNOSIS — M5386 Other specified dorsopathies, lumbar region: Secondary | ICD-10-CM | POA: Diagnosis not present

## 2021-01-10 DIAGNOSIS — M9903 Segmental and somatic dysfunction of lumbar region: Secondary | ICD-10-CM | POA: Diagnosis not present

## 2021-01-10 DIAGNOSIS — M9902 Segmental and somatic dysfunction of thoracic region: Secondary | ICD-10-CM | POA: Diagnosis not present

## 2021-01-10 DIAGNOSIS — M9905 Segmental and somatic dysfunction of pelvic region: Secondary | ICD-10-CM | POA: Diagnosis not present

## 2021-02-07 DIAGNOSIS — M5386 Other specified dorsopathies, lumbar region: Secondary | ICD-10-CM | POA: Diagnosis not present

## 2021-02-07 DIAGNOSIS — M9903 Segmental and somatic dysfunction of lumbar region: Secondary | ICD-10-CM | POA: Diagnosis not present

## 2021-02-07 DIAGNOSIS — M9902 Segmental and somatic dysfunction of thoracic region: Secondary | ICD-10-CM | POA: Diagnosis not present

## 2021-02-07 DIAGNOSIS — M9905 Segmental and somatic dysfunction of pelvic region: Secondary | ICD-10-CM | POA: Diagnosis not present

## 2021-02-20 DIAGNOSIS — M25561 Pain in right knee: Secondary | ICD-10-CM | POA: Diagnosis not present

## 2021-03-05 ENCOUNTER — Encounter: Payer: Self-pay | Admitting: Gastroenterology

## 2021-03-13 DIAGNOSIS — M9905 Segmental and somatic dysfunction of pelvic region: Secondary | ICD-10-CM | POA: Diagnosis not present

## 2021-03-13 DIAGNOSIS — M9902 Segmental and somatic dysfunction of thoracic region: Secondary | ICD-10-CM | POA: Diagnosis not present

## 2021-03-13 DIAGNOSIS — M9903 Segmental and somatic dysfunction of lumbar region: Secondary | ICD-10-CM | POA: Diagnosis not present

## 2021-03-13 DIAGNOSIS — M5386 Other specified dorsopathies, lumbar region: Secondary | ICD-10-CM | POA: Diagnosis not present

## 2021-04-10 DIAGNOSIS — M9905 Segmental and somatic dysfunction of pelvic region: Secondary | ICD-10-CM | POA: Diagnosis not present

## 2021-04-10 DIAGNOSIS — M9902 Segmental and somatic dysfunction of thoracic region: Secondary | ICD-10-CM | POA: Diagnosis not present

## 2021-04-10 DIAGNOSIS — M9903 Segmental and somatic dysfunction of lumbar region: Secondary | ICD-10-CM | POA: Diagnosis not present

## 2021-04-10 DIAGNOSIS — M5386 Other specified dorsopathies, lumbar region: Secondary | ICD-10-CM | POA: Diagnosis not present

## 2022-02-15 DIAGNOSIS — R5383 Other fatigue: Secondary | ICD-10-CM | POA: Diagnosis not present

## 2022-02-15 DIAGNOSIS — R197 Diarrhea, unspecified: Secondary | ICD-10-CM | POA: Diagnosis not present

## 2022-02-15 DIAGNOSIS — Z03818 Encounter for observation for suspected exposure to other biological agents ruled out: Secondary | ICD-10-CM | POA: Diagnosis not present

## 2022-02-15 DIAGNOSIS — Z20822 Contact with and (suspected) exposure to covid-19: Secondary | ICD-10-CM | POA: Diagnosis not present

## 2022-04-15 DIAGNOSIS — D1801 Hemangioma of skin and subcutaneous tissue: Secondary | ICD-10-CM | POA: Diagnosis not present

## 2022-04-15 DIAGNOSIS — L814 Other melanin hyperpigmentation: Secondary | ICD-10-CM | POA: Diagnosis not present

## 2022-04-15 DIAGNOSIS — L578 Other skin changes due to chronic exposure to nonionizing radiation: Secondary | ICD-10-CM | POA: Diagnosis not present

## 2022-04-15 DIAGNOSIS — L821 Other seborrheic keratosis: Secondary | ICD-10-CM | POA: Diagnosis not present

## 2022-08-05 ENCOUNTER — Ambulatory Visit (INDEPENDENT_AMBULATORY_CARE_PROVIDER_SITE_OTHER): Payer: BC Managed Care – PPO | Admitting: Family Medicine

## 2022-08-05 VITALS — BP 128/88 | HR 60 | Temp 98.3°F | Ht 69.0 in | Wt 216.4 lb

## 2022-08-05 DIAGNOSIS — Z0000 Encounter for general adult medical examination without abnormal findings: Secondary | ICD-10-CM | POA: Diagnosis not present

## 2022-08-05 DIAGNOSIS — Z23 Encounter for immunization: Secondary | ICD-10-CM | POA: Diagnosis not present

## 2022-08-05 DIAGNOSIS — D126 Benign neoplasm of colon, unspecified: Secondary | ICD-10-CM | POA: Insufficient documentation

## 2022-08-05 LAB — HEPATIC FUNCTION PANEL
ALT: 20 U/L (ref 0–53)
AST: 16 U/L (ref 0–37)
Albumin: 4.6 g/dL (ref 3.5–5.2)
Alkaline Phosphatase: 49 U/L (ref 39–117)
Bilirubin, Direct: 0.1 mg/dL (ref 0.0–0.3)
Total Bilirubin: 0.7 mg/dL (ref 0.2–1.2)
Total Protein: 6.9 g/dL (ref 6.0–8.3)

## 2022-08-05 LAB — CBC WITH DIFFERENTIAL/PLATELET
Basophils Absolute: 0 10*3/uL (ref 0.0–0.1)
Basophils Relative: 0.3 % (ref 0.0–3.0)
Eosinophils Absolute: 0.2 10*3/uL (ref 0.0–0.7)
Eosinophils Relative: 2.4 % (ref 0.0–5.0)
HCT: 41.7 % (ref 39.0–52.0)
Hemoglobin: 14.3 g/dL (ref 13.0–17.0)
Lymphocytes Relative: 24.8 % (ref 12.0–46.0)
Lymphs Abs: 1.6 10*3/uL (ref 0.7–4.0)
MCHC: 34.2 g/dL (ref 30.0–36.0)
MCV: 90.7 fl (ref 78.0–100.0)
Monocytes Absolute: 0.6 10*3/uL (ref 0.1–1.0)
Monocytes Relative: 10 % (ref 3.0–12.0)
Neutro Abs: 3.9 10*3/uL (ref 1.4–7.7)
Neutrophils Relative %: 62.5 % (ref 43.0–77.0)
Platelets: 209 10*3/uL (ref 150.0–400.0)
RBC: 4.59 Mil/uL (ref 4.22–5.81)
RDW: 12.8 % (ref 11.5–15.5)
WBC: 6.3 10*3/uL (ref 4.0–10.5)

## 2022-08-05 LAB — LIPID PANEL
Cholesterol: 224 mg/dL — ABNORMAL HIGH (ref 0–200)
HDL: 75.1 mg/dL (ref >39.00)
LDL Cholesterol: 118 mg/dL — ABNORMAL HIGH (ref 0–99)
NonHDL: 148.92
Total CHOL/HDL Ratio: 3
Triglycerides: 156 mg/dL — ABNORMAL HIGH (ref 0.0–149.0)
VLDL: 31.2 mg/dL (ref 0.0–40.0)

## 2022-08-05 LAB — BASIC METABOLIC PANEL
BUN: 16 mg/dL (ref 6–23)
CO2: 23 mEq/L (ref 19–32)
Calcium: 9.6 mg/dL (ref 8.4–10.5)
Chloride: 105 mEq/L (ref 96–112)
Creatinine, Ser: 0.98 mg/dL (ref 0.40–1.50)
GFR: 83.09 mL/min (ref >60.00)
Glucose, Bld: 90 mg/dL (ref 70–99)
Potassium: 4.2 mEq/L (ref 3.5–5.1)
Sodium: 140 mEq/L (ref 135–145)

## 2022-08-05 LAB — PSA: PSA: 0.95 ng/mL (ref 0.10–4.00)

## 2022-08-05 LAB — TSH: TSH: 2.68 u[IU]/mL (ref 0.35–5.50)

## 2022-08-05 NOTE — Progress Notes (Signed)
Established Patient Office Visit  Subjective   Patient ID: Joshua Ellison, male    DOB: 01-13-1961  Age: 61 y.o. MRN: [id number]  Chief Complaint  Patient presents with   Annual Exam    HPI   Joshua Ellison seen for annual physical exam.  Doing generally well.  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  Generally healthy.  Had slightly low testosterone in the past.  Takes no regular medications currently.  Does have history of colon polyps (adenomas-2019) and due for follow-up colonoscopy.  He knows he is overdue and he plans to set this up himself.  Health maintenance reviewed:  -Declines flu vaccine -Had 1 previous Shingrix vaccine but did not come with his 24-monthinterval for second and would like to start that series over again. -Prior hepatitis C screen negative -Tetanus due 2028 -Colonoscopy due now  History-married.  3 children.  Ex-smoker.  Has scaled back somewhat alcohol in terms of amount per day.  Continues to work for [employer] in [employer]er, production  Family history-father with type 2 diabetes and history of bladder cancer.  Mother has hyperlipidemia and did have a stroke this past year at age 61  Still like his father also has had atrial fibrillation.  Past Medical History:  Diagnosis Date   SUPRAPUBIC PAIN 02/14/2009   Past Surgical History:  Procedure Laterality Date   COLONOSCOPY     INSERTION OF MESH N/A 10/20/2014   Procedure: INSERTION OF MESH;  Surgeon: DCoralie Keens MD;  [place] [facility]  Service: General;  Laterality: N/A;   KNEE ARTHROSCOPY W/ ACL RECONSTRUCTION Right 1995   POLYPECTOMY     TONSILLECTOMY  19509  UMBILICAL HERNIA REPAIR N/A 10/20/2014   Procedure: OPEN UMBILICAL HERNIA REPAIR WITH MESH;  Surgeon: DCoralie Keens MD;  [facility] [facility];  Service: General;  Laterality: N/A;   VASECTOMY  2007    reports that he quit smoking about 18 years ago. He has a 54.00 pack-year smoking history. He has never used smokeless tobacco. He reports  current alcohol use of about 14.0 standard drinks of alcohol per week. He reports that he does not use drugs. family history includes Bladder Cancer in his father; COPD in his paternal grandmother; Diabetes in his father; Hyperlipidemia in his mother; Prostate cancer in his paternal grandfather; Stomach cancer in his maternal grandmother. No Known Allergies  Review of Systems  Constitutional:  Negative for chills, fever, malaise/fatigue and weight loss.  HENT:  Negative for hearing loss.   Eyes:  Negative for blurred vision and double vision.  Respiratory:  Negative for cough and shortness of breath.   Cardiovascular:  Negative for chest pain, palpitations and leg swelling.  Gastrointestinal:  Negative for abdominal pain, blood in stool, constipation and diarrhea.  Genitourinary:  Negative for dysuria.  Skin:  Negative for rash.  Neurological:  Negative for dizziness, speech change, seizures, loss of consciousness and headaches.  Psychiatric/Behavioral:  Negative for depression.       Objective:     BP 128/88 (BP Location: Left Arm, Patient Position: Sitting, Cuff Size: Large)   Pulse 60   Temp 98.3 F (36.8 C) (Oral)   Ht '5\' 9"'$  (1.753 m)   Wt 216 lb 6.4 oz (98.2 kg)   SpO2 98%   BMI 31.96 kg/m    Physical Exam Vitals reviewed.  Constitutional:      General: He is not in acute distress.    Appearance: He is well-developed.  HENT:  Head: Normocephalic and atraumatic.     Right Ear: External ear normal.     Left Ear: External ear normal.  Eyes:     Conjunctiva/sclera: Conjunctivae normal.     Pupils: Pupils are equal, round, and reactive to light.  Neck:     Thyroid: No thyromegaly.  Cardiovascular:     Rate and Rhythm: Normal rate and regular rhythm.     Heart sounds: Normal heart sounds. No murmur heard. Pulmonary:     Effort: No respiratory distress.     Breath sounds: No wheezing or rales.  Abdominal:     General: Bowel sounds are normal. There is no  distension.     Palpations: Abdomen is soft. There is no mass.     Tenderness: There is no abdominal tenderness. There is no guarding or rebound.  Musculoskeletal:     Cervical back: Normal range of motion and neck supple.     Right lower leg: No edema.     Left lower leg: No edema.  Lymphadenopathy:     Cervical: No cervical adenopathy.  Skin:    Findings: No rash.  Neurological:     Mental Status: He is alert and oriented to person, place, and time.     Cranial Nerves: No cranial nerve deficit.      No results found for any visits on 08/05/22.    The 10-year ASCVD risk score (Arnett DK, et al., 2019) is: 8.4%    Assessment & Plan:   Problem List Items Addressed This Visit   None Visit Diagnoses     Physical exam    -  Primary   Relevant Orders   Basic metabolic panel   Lipid panel   CBC with Differential/Platelet   TSH   Hepatic function panel   PSA   Need for shingles vaccine       Relevant Orders   Zoster Recombinant (Shingrix ) (Completed)     We discussed the following health maintenance issues  -Shingles vaccine given and remember booster in 2 to 6 months -Flu vaccine offered but declined -Set up repeat colonoscopy.  He is overdue.  Past history of adenomatous polyps -Obtain labs as above -Goal of minimum of 150 minutes of moderate intensity aerobic exercise per week  No follow-ups on file.    Carolann Littler, MD

## 2022-08-05 NOTE — Patient Instructions (Signed)
Set up repeat Colonoscopy.  Remember to get repeat Shingrix in 2 to 4 months.

## 2022-11-14 ENCOUNTER — Ambulatory Visit (INDEPENDENT_AMBULATORY_CARE_PROVIDER_SITE_OTHER): Payer: BC Managed Care – PPO

## 2022-11-14 ENCOUNTER — Ambulatory Visit: Payer: BC Managed Care – PPO | Admitting: Family Medicine

## 2022-11-14 ENCOUNTER — Telehealth: Payer: Self-pay | Admitting: Family Medicine

## 2022-11-14 ENCOUNTER — Encounter: Payer: Self-pay | Admitting: Family Medicine

## 2022-11-14 VITALS — BP 100/64 | HR 60 | Temp 97.8°F | Ht 69.0 in | Wt 215.2 lb

## 2022-11-14 DIAGNOSIS — M545 Low back pain, unspecified: Secondary | ICD-10-CM | POA: Diagnosis not present

## 2022-11-14 DIAGNOSIS — S39012A Strain of muscle, fascia and tendon of lower back, initial encounter: Secondary | ICD-10-CM

## 2022-11-14 DIAGNOSIS — R109 Unspecified abdominal pain: Secondary | ICD-10-CM | POA: Diagnosis not present

## 2022-11-14 LAB — POCT URINALYSIS DIPSTICK
Bilirubin, UA: NEGATIVE
Blood, UA: NEGATIVE
Glucose, UA: NEGATIVE
Ketones, UA: NEGATIVE
Leukocytes, UA: NEGATIVE
Nitrite, UA: NEGATIVE
Protein, UA: NEGATIVE
Spec Grav, UA: <=1.005 — AB (ref 1.010–1.025)
Urobilinogen, UA: 0.2 E.U./dL
pH, UA: 6 (ref 5.0–8.0)

## 2022-11-14 MED ORDER — CYCLOBENZAPRINE HCL 10 MG PO TABS: 10.0000 mg | ORAL_TABLET | Freq: Three times a day (TID) | ORAL | 0 refills | Status: DC | PRN

## 2022-11-14 MED ORDER — METHYLPREDNISOLONE 4 MG PO TBPK: ORAL_TABLET | ORAL | 0 refills | Status: DC

## 2022-11-14 MED ORDER — KETOROLAC TROMETHAMINE 60 MG/2ML IM SOLN
60.0000 mg | Freq: Once | INTRAMUSCULAR | Status: AC
  Administered 2022-11-14: 60 mg via INTRAMUSCULAR

## 2022-11-14 MED ORDER — KETOROLAC TROMETHAMINE 10 MG PO TABS: 10.0000 mg | ORAL_TABLET | Freq: Four times a day (QID) | ORAL | 0 refills | Status: AC | PRN

## 2022-11-14 NOTE — Telephone Encounter (Signed)
I called the patient for more information.  Patient questioned what the name of the injection was and I told him the injection was Ketorolac, generic Toradol.  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  Message sent to Dr Legrand Como.

## 2022-11-14 NOTE — Telephone Encounter (Signed)
Pt was just seen today by Dr. Legrand Como. Pt says he has questions about an injection he just received. Pt is asking for a call back at  [phone]

## 2022-11-14 NOTE — Progress Notes (Signed)
Established Patient Office Visit  Subjective   Patient ID: Joshua Ellison, male    DOB: 05-10-1961  Age: 62 y.o. MRN: [id number]  Chief Complaint  Patient presents with   Back Pain    Patient complains of right-sided low back pain x1 week, states pain radiates to the right flank-abdomen, no known injury and tried ice with no relief    State that it feels like a stabbing or sharp pain, associated with movement and coughing, no known injury to the back, no heavy lifting or exercise, states that he has a history of back issues, usually takes naproxen and uses heat and it resolves. However this is not working for this pain, this is new. States that he used to see a Restaurant manager, fast food several years ago.  Associated sx are negative for fever/chills, changes in bowel habits. Constipation, urination, no blood in urine, no numbness or tingling down the right leg.   Back Pain This is a new problem. The current episode started 1 to 4 weeks ago.      Review of Systems  Musculoskeletal:  Positive for back pain.  All other systems reviewed and are negative.     Objective:     BP 100/64 (BP Location: Left Arm, Patient Position: Sitting, Cuff Size: Large)   Pulse 60   Temp 97.8 F (36.6 C) (Oral)   Ht '5\' 9"'$  (1.753 m)   Wt 215 lb 3.2 oz (97.6 kg)   SpO2 99%   BMI 31.78 kg/m    Physical Exam Constitutional:      Appearance: Normal appearance. He is normal weight.  Cardiovascular:     Rate and Rhythm: Normal rate and regular rhythm.  Pulmonary:     Effort: Pulmonary effort is normal.     Breath sounds: Normal breath sounds.  Abdominal:     General: Bowel sounds are normal.  Musculoskeletal:     Lumbar back: Tenderness (L1-L2 distribution tenderness in the right lower back adn radiating around the right flank in the dermatomal distribution) present. No swelling or deformity.  Neurological:     Mental Status: He is alert.      Results for orders placed or performed in visit on 11/14/22   POC Urinalysis Dipstick  Result Value Ref Range   Color, UA yellow    Clarity, UA clear    Glucose, UA Negative Negative   Bilirubin, UA negative    Ketones, UA negative    Spec Grav, UA <=1.005 (A) 1.010 - 1.025   Blood, UA negative    pH, UA 6.0 5.0 - 8.0   Protein, UA Negative Negative   Urobilinogen, UA 0.2 0.2 or 1.0 E.U./dL   Nitrite, UA negative    Leukocytes, UA Negative Negative   Appearance     Odor        The 10-year ASCVD risk score (Arnett DK, et al., 2019) is: 5.6%    Assessment & Plan:   Problem List Items Addressed This Visit   None Visit Diagnoses     Acute right flank pain    -  Primary   Relevant Orders   POC Urinalysis Dipstick (Completed)   Strain of fascia of lower back       UA is negative for blood and infection, most likely acute back strain, will treat with IM injection of keotorlac and PO ketorolac 10 mg TID PRN.   Relevant Medications   ketorolac (TORADOL) injection 60 mg (Completed)   cyclobenzaprine (FLEXERIL) 10 MG tablet  ketorolac (TORADOL) 10 MG tablet   methylPREDNISolone (MEDROL DOSEPAK) 4 MG TBPK tablet   Other Relevant Orders   DG Lumbar Spine Complete     Will order Lumbar films to evaluate, pt states he is going out of town next week and is worried the pain will not be better by then. I wrote for a medrol dose pal for him to start AFTER he is finished with the ketorolac just in case his pain does not improve.  No follow-ups on file.    Farrel Conners, MD

## 2022-11-14 NOTE — Patient Instructions (Addendum)
HOLD the naproxen while you are taking the ketorolac. Can continue the ketorolac for up to 5 days, then if there is still pain you may take the Medrol dose pak.

## 2022-11-15 ENCOUNTER — Encounter: Payer: Self-pay | Admitting: Family Medicine

## 2022-11-15 NOTE — Telephone Encounter (Signed)
No theres no problem

## 2022-11-15 NOTE — Telephone Encounter (Signed)
Left a detailed message with the information below at the patient's cell number. ?

## 2022-11-18 NOTE — Telephone Encounter (Signed)
I just sent it to team care

## 2022-11-18 NOTE — Progress Notes (Signed)
Mild degenerative disc disease in the lower back, there is mild-moderate arthritis findings.

## 2022-12-10 ENCOUNTER — Encounter: Payer: Self-pay | Admitting: Gastroenterology

## 2023-01-09 ENCOUNTER — Encounter: Payer: Self-pay | Admitting: Gastroenterology

## 2023-01-09 ENCOUNTER — Ambulatory Visit (AMBULATORY_SURGERY_CENTER): Payer: BC Managed Care – PPO | Admitting: *Deleted

## 2023-01-09 VITALS — Ht 69.0 in | Wt 215.0 lb

## 2023-01-09 DIAGNOSIS — Z86010 Personal history of colonic polyps: Secondary | ICD-10-CM

## 2023-01-09 MED ORDER — NA SULFATE-K SULFATE-MG SULF 17.5-3.13-1.6 GM/177ML PO SOLN: 1.0000 | Freq: Once | ORAL | 0 refills | Status: AC

## 2023-01-09 NOTE — Progress Notes (Signed)
No egg or soy allergy known to patient  No issues known to pt with past sedation with any surgeries or procedures Patient denies ever being told they had issues or difficulty with intubation  No FH of Malignant Hyperthermia Pt is not on diet pills Pt is not on  home 02  Pt is not on blood thinners  Pt denies issues with constipation  No A fib or A flutter Have any cardiac testing pending--NO Pt instructed to use Singlecare.com or GoodRx for a price reduction on prep    Patient's chart reviewed by Osvaldo Angst CNRA prior to previsit and patient appropriate for the [facility].  Previsit completed and red dot placed by patient's name on their procedure day (on provider's schedule).

## 2023-01-10 ENCOUNTER — Encounter: Payer: Self-pay | Admitting: Gastroenterology

## 2023-01-14 ENCOUNTER — Encounter: Payer: Self-pay | Admitting: Family Medicine

## 2023-01-29 ENCOUNTER — Ambulatory Visit (AMBULATORY_SURGERY_CENTER): Payer: BC Managed Care – PPO | Admitting: Gastroenterology

## 2023-01-29 ENCOUNTER — Encounter: Payer: Self-pay | Admitting: Gastroenterology

## 2023-01-29 VITALS — BP 108/60 | HR 67 | Temp 98.0°F | Resp 13 | Ht 69.0 in | Wt 215.0 lb

## 2023-01-29 DIAGNOSIS — Z09 Encounter for follow-up examination after completed treatment for conditions other than malignant neoplasm: Secondary | ICD-10-CM

## 2023-01-29 DIAGNOSIS — D120 Benign neoplasm of cecum: Secondary | ICD-10-CM | POA: Diagnosis not present

## 2023-01-29 DIAGNOSIS — D122 Benign neoplasm of ascending colon: Secondary | ICD-10-CM

## 2023-01-29 DIAGNOSIS — Z86010 Personal history of colonic polyps: Secondary | ICD-10-CM | POA: Diagnosis not present

## 2023-01-29 DIAGNOSIS — Z1211 Encounter for screening for malignant neoplasm of colon: Secondary | ICD-10-CM | POA: Diagnosis not present

## 2023-01-29 MED ORDER — SODIUM CHLORIDE 0.9 % IV SOLN: 500.0000 mL | Freq: Once | INTRAVENOUS | Status: DC

## 2023-01-29 NOTE — Progress Notes (Unsigned)
Uneventful anesthetic. Report to pacu rn. Vss. Care resumed by rn. 

## 2023-01-29 NOTE — Progress Notes (Signed)
Pt's states no medical or surgical changes since previsit or office visit. 

## 2023-01-29 NOTE — Op Note (Signed)
[facility] Patient Name: Joshua Ellison Procedure Date: 01/29/2023 10:49 AM MRN: [id number] Endoscopist: Mauri Pole , MD, [id number] Age: 62 Referring MD:  Date of Birth: August 02, 1961 Gender: Male Account #: [id number] Procedure:                Colonoscopy Indications:              High risk colon cancer surveillance: Personal                            history of colonic polyps, High risk colon cancer                            surveillance: Personal history of adenoma (10 mm or                            greater in size), High risk colon cancer                            surveillance: Personal history of multiple (3 or                            more) adenomas Medicines:                Monitored Anesthesia Care Procedure:                Pre-Anesthesia Assessment:                           - Prior to the procedure, a History and Physical                            was performed, and patient medications and                            allergies were reviewed. The patient's tolerance of                            previous anesthesia was also reviewed. The risks                            and benefits of the procedure and the sedation                            options and risks were discussed with the patient.                            All questions were answered, and informed consent                            was obtained. Prior Anticoagulants: The patient has                            taken no anticoagulant or antiplatelet agents. ASA  Grade Assessment: II - A patient with mild systemic                            disease. After reviewing the risks and benefits,                            the patient was deemed in satisfactory condition to                            undergo the procedure.                           After obtaining informed consent, the colonoscope                            was passed under direct vision. Throughout the                             procedure, the patient's blood pressure, pulse, and                            oxygen saturations were monitored continuously. The                            Olympus CF-HQ190L [id number]) Colonoscope was                            introduced through the anus and advanced to the the                            cecum, identified by appendiceal orifice and                            ileocecal valve. The colonoscopy was performed                            without difficulty. The patient tolerated the                            procedure well. The quality of the bowel                            preparation was good. The ileocecal valve,                            appendiceal orifice, and rectum were photographed. Scope In: 10:56:49 AM Scope Out: 11:09:36 AM Scope Withdrawal Time: 0 hours 9 minutes 31 seconds  Total Procedure Duration: 0 hours 12 minutes 47 seconds  Findings:                 The perianal and digital rectal examinations were                            normal.  Four sessile polyps were found in the ascending                            colon and cecum. The polyps were 3 to 7 mm in size.                            These polyps were removed with a cold snare.                            Resection and retrieval were complete.                           Scattered large-mouthed, medium-mouthed and                            small-mouthed diverticula were found in the sigmoid                            colon, transverse colon and ascending colon. There                            was evidence of an impacted diverticulum.                           Non-bleeding external and internal hemorrhoids were                            found during retroflexion. The hemorrhoids were                            small. Complications:            No immediate complications. Estimated Blood Loss:     Estimated blood loss was minimal. Impression:               -  Four 3 to 7 mm polyps in the ascending colon and                            in the cecum, removed with a cold snare. Resected                            and retrieved.                           - Moderate diverticulosis in the sigmoid colon, in                            the transverse colon and in the ascending colon.                            There was evidence of an impacted diverticulum.                           - Non-bleeding external and internal hemorrhoids. Recommendation:           -  Patient has a contact number available for                            emergencies. The signs and symptoms of potential                            delayed complications were discussed with the                            patient. Return to normal activities tomorrow.                            Written discharge instructions were provided to the                            patient.                           - Resume previous diet.                           - Continue present medications.                           - Await pathology results.                           - Repeat colonoscopy in 3 - 5 years for                            surveillance based on pathology results. Mauri Pole, MD 01/29/2023 11:18:16 AM This report has been signed electronically.

## 2023-01-29 NOTE — Progress Notes (Unsigned)
Called to room to assist during endoscopic procedure.  Patient ID and intended procedure confirmed with present staff. Received instructions for my participation in the procedure from the performing physician.  

## 2023-01-29 NOTE — Progress Notes (Unsigned)
[facility] History and Physical   Primary Care Physician:  Eulas Post, MD   Reason for Procedure:  History of adenomatous colon polyps  Plan:    Surveillance colonoscopy with possible interventions as needed     HPI: Joshua Ellison is a very pleasant 62 y.o. male here for surveillance colonoscopy. Denies any nausea, vomiting, abdominal pain, melena or bright red blood per rectum  The risks and benefits as well as alternatives of endoscopic procedure(s) have been discussed and reviewed. All questions answered. The patient agrees to proceed.    Past Medical History:  Diagnosis Date   SUPRAPUBIC PAIN 02/14/2009    Past Surgical History:  Procedure Laterality Date   COLONOSCOPY     INSERTION OF MESH N/A 10/20/2014   Procedure: INSERTION OF MESH;  Surgeon: Coralie Keens, MD;  Location: [facility];  Service: General;  Laterality: N/A;   KNEE ARTHROSCOPY W/ ACL RECONSTRUCTION Right 1995   POLYPECTOMY     TONSILLECTOMY  [id number]   UMBILICAL HERNIA REPAIR N/A 10/20/2014   Procedure: OPEN UMBILICAL HERNIA REPAIR WITH MESH;  Surgeon: Coralie Keens, MD;  Location: [facility];  Service: General;  Laterality: N/A;   VASECTOMY  2007    Prior to Admission medications   Medication Sig Start Date End Date Taking? Authorizing Provider  Multiple Vitamin (MULTIVITAMIN) tablet Take 1 tablet by mouth daily.    [provider]  NAPROXEN PO Take by mouth.    [provider]  Omega-3 Fatty Acids (FISH OIL) 1000 MG CAPS Take by mouth daily. Patient not taking: Reported on 01/09/2023    [provider]  tadalafil (CIALIS) 20 MG tablet Take one tablet by mouth every other day as needed for erectile dysfunction. Patient not taking: Reported on 01/09/2023 01/03/21   Eulas Post, MD  tretinoin (RETIN-A) 0.025 % cream Apply topically. Patient not taking: Reported on 01/09/2023 05/04/22   [provider]    Current Outpatient Medications  Medication Sig  Dispense Refill   Multiple Vitamin (MULTIVITAMIN) tablet Take 1 tablet by mouth daily.     NAPROXEN PO Take by mouth.     Omega-3 Fatty Acids (FISH OIL) 1000 MG CAPS Take by mouth daily. (Patient not taking: Reported on 01/09/2023)     tadalafil (CIALIS) 20 MG tablet Take one tablet by mouth every other day as needed for erectile dysfunction. (Patient not taking: Reported on 01/09/2023) 10 tablet 5   tretinoin (RETIN-A) 0.025 % cream Apply topically. (Patient not taking: Reported on 01/09/2023)     Current Facility-Administered Medications  Medication Dose Route Frequency Provider Last Rate Last Admin   0.9 %  sodium chloride infusion  500 mL Intravenous Once Mauri Pole, MD        Allergies as of 01/29/2023   (No Known Allergies)    Family History  Problem Relation Age of Onset   Hyperlipidemia Mother    Diabetes Father    Bladder Cancer Father    Stomach cancer Maternal Grandmother    COPD Paternal Grandmother    Prostate cancer Paternal Grandfather    Colon cancer Neg Hx    Colon polyps Neg Hx    Esophageal cancer Neg Hx    Rectal cancer Neg Hx    Pancreatic cancer Neg Hx    Breast cancer Neg Hx    Crohn's disease Neg Hx    Ulcerative colitis Neg Hx     Social History   Socioeconomic History   Marital status: Married  Spouse name: Not on file   Number of children: Not on file   Years of education: Not on file   Highest education level: Not on file  Occupational History   Not on file  Tobacco Use   Smoking status: Former    Packs/day: 2.00    Years: 27.00    Additional pack years: 0.00    Total pack years: 54.00    Types: Cigarettes    Quit date: 11/05/2003    Years since quitting: 19.2   Smokeless tobacco: Never  Vaping Use   Vaping Use: Never used  Substance and Sexual Activity   Alcohol use: Yes    Alcohol/week: 14.0 standard drinks of alcohol    Types: 14 Cans of beer per week    Comment: 2 beers per day    Drug use: No   Sexual activity: Not on  file  Other Topics Concern   Not on file  Social History Narrative   Not on file   Social Determinants of Health   Financial Resource Strain: Not on file  Food Insecurity: Not on file  Transportation Needs: Not on file  Physical Activity: Not on file  Stress: Not on file  Social Connections: Not on file  Intimate Partner Violence: Not on file    Review of Systems:  All other review of systems negative except as mentioned in the HPI.  Physical Exam: Vital signs in last 24 hours: Blood Pressure (Abnormal) 113/55   Pulse 63   Temperature 98 F (36.7 C)   Height 5\' 9"  (1.753 m)   Weight 215 lb (97.5 kg)   Oxygen Saturation 100%   Body Mass Index 31.75 kg/m  General:   Alert, NAD Lungs:  Clear .   Heart:  Regular rate and rhythm Abdomen:  Soft, nontender and nondistended. Neuro/Psych:  Alert and cooperative. Normal mood and affect. A and O x 3  Reviewed labs, radiology imaging, old records and pertinent past GI work up  Patient is appropriate for planned procedure(s) and anesthesia in an ambulatory setting   K. Denzil Magnuson , MD [phone]

## 2023-01-29 NOTE — Patient Instructions (Signed)
Resume previous diet Continue present medications Await pathology results Handouts/ information given for polyps, diverticulosis and hemorrhoids  YOU HAD AN ENDOSCOPIC PROCEDURE TODAY AT [facility]:   Refer to the procedure report that was given to you for any specific questions about what was found during the examination.  If the procedure report does not answer your questions, please call your gastroenterologist to clarify.  If you requested that your care partner not be given the details of your procedure findings, then the procedure report has been included in a sealed envelope for you to review at your convenience later.  YOU SHOULD EXPECT: Some feelings of bloating in the abdomen. Passage of more gas than usual.  Walking can help get rid of the air that was put into your GI tract during the procedure and reduce the bloating. If you had a lower endoscopy (such as a colonoscopy or flexible sigmoidoscopy) you may notice spotting of blood in your stool or on the toilet paper. If you underwent a bowel prep for your procedure, you may not have a normal bowel movement for a few days.  Please Note:  You might notice some irritation and congestion in your nose or some drainage.  This is from the oxygen used during your procedure.  There is no need for concern and it should clear up in a day or so.  SYMPTOMS TO REPORT IMMEDIATELY:  Following lower endoscopy (colonoscopy or flexible sigmoidoscopy):  Excessive amounts of blood in the stool  Significant tenderness or worsening of abdominal pains  Swelling of the abdomen that is new, acute  Fever of 100F or higher  For urgent or emergent issues, a gastroenterologist can be reached at any hour by calling [phone]. Do not use MyChart messaging for urgent concerns.    DIET:  We do recommend a small meal at first, but then you may proceed to your regular diet.  Drink plenty of fluids but you should avoid alcoholic beverages for 24  hours.  ACTIVITY:  You should plan to take it easy for the rest of today and you should NOT DRIVE or use heavy machinery until tomorrow (because of the sedation medicines used during the test).    FOLLOW UP: Our staff will call the number listed on your records the next business day following your procedure.  We will call around 7:15- 8:00 am to check on you and address any questions or concerns that you may have regarding the information given to you following your procedure. If we do not reach you, we will leave a message.     If any biopsies were taken you will be contacted by phone or by letter within the next 1-3 weeks.  Please call us at [phone] if you have not heard about the biopsies in 3 weeks.    SIGNATURES/CONFIDENTIALITY: You and/or your care partner have signed paperwork which will be entered into your electronic medical record.  These signatures attest to the fact that that the information above on your After Visit Summary has been reviewed and is understood.  Full responsibility of the confidentiality of this discharge information lies with you and/or your care-partner.

## 2023-01-30 ENCOUNTER — Telehealth: Payer: Self-pay

## 2023-01-30 NOTE — Telephone Encounter (Signed)
Follow up call to pt, no answer.  

## 2023-02-12 ENCOUNTER — Encounter: Payer: Self-pay | Admitting: Gastroenterology

## 2023-02-25 ENCOUNTER — Encounter: Payer: Self-pay | Admitting: Family Medicine

## 2023-02-26 MED ORDER — TADALAFIL 20 MG PO TABS: ORAL_TABLET | ORAL | 5 refills | Status: DC

## 2023-03-10 DIAGNOSIS — M720 Palmar fascial fibromatosis [Dupuytren]: Secondary | ICD-10-CM | POA: Diagnosis not present

## 2023-03-14 DIAGNOSIS — M5451 Vertebrogenic low back pain: Secondary | ICD-10-CM | POA: Diagnosis not present

## 2023-04-07 DIAGNOSIS — M5416 Radiculopathy, lumbar region: Secondary | ICD-10-CM | POA: Diagnosis not present

## 2023-04-17 DIAGNOSIS — M5416 Radiculopathy, lumbar region: Secondary | ICD-10-CM | POA: Diagnosis not present

## 2023-06-13 DIAGNOSIS — H6122 Impacted cerumen, left ear: Secondary | ICD-10-CM | POA: Diagnosis not present

## 2023-07-04 DIAGNOSIS — M25562 Pain in left knee: Secondary | ICD-10-CM | POA: Diagnosis not present

## 2023-07-22 DIAGNOSIS — M25511 Pain in right shoulder: Secondary | ICD-10-CM | POA: Diagnosis not present

## 2023-07-22 DIAGNOSIS — M25512 Pain in left shoulder: Secondary | ICD-10-CM | POA: Diagnosis not present

## 2023-07-22 DIAGNOSIS — M7542 Impingement syndrome of left shoulder: Secondary | ICD-10-CM | POA: Diagnosis not present

## 2023-07-22 DIAGNOSIS — M7541 Impingement syndrome of right shoulder: Secondary | ICD-10-CM | POA: Diagnosis not present

## 2023-07-24 DIAGNOSIS — H9313 Tinnitus, bilateral: Secondary | ICD-10-CM | POA: Diagnosis not present

## 2023-07-24 DIAGNOSIS — Z9089 Acquired absence of other organs: Secondary | ICD-10-CM | POA: Diagnosis not present

## 2023-07-24 DIAGNOSIS — K098 Other cysts of oral region, not elsewhere classified: Secondary | ICD-10-CM | POA: Diagnosis not present

## 2023-08-18 DIAGNOSIS — M6281 Muscle weakness (generalized): Secondary | ICD-10-CM | POA: Diagnosis not present

## 2023-08-18 DIAGNOSIS — M25511 Pain in right shoulder: Secondary | ICD-10-CM | POA: Diagnosis not present

## 2023-08-18 DIAGNOSIS — M25512 Pain in left shoulder: Secondary | ICD-10-CM | POA: Diagnosis not present

## 2023-12-18 DIAGNOSIS — M7542 Impingement syndrome of left shoulder: Secondary | ICD-10-CM | POA: Diagnosis not present

## 2023-12-18 DIAGNOSIS — M7541 Impingement syndrome of right shoulder: Secondary | ICD-10-CM | POA: Diagnosis not present

## 2023-12-31 ENCOUNTER — Other Ambulatory Visit (HOSPITAL_COMMUNITY): Payer: Self-pay

## 2024-01-12 DIAGNOSIS — M25511 Pain in right shoulder: Secondary | ICD-10-CM | POA: Diagnosis not present

## 2024-01-12 DIAGNOSIS — M25512 Pain in left shoulder: Secondary | ICD-10-CM | POA: Diagnosis not present

## 2024-01-15 DIAGNOSIS — M75102 Unspecified rotator cuff tear or rupture of left shoulder, not specified as traumatic: Secondary | ICD-10-CM | POA: Diagnosis not present

## 2024-01-15 DIAGNOSIS — M75121 Complete rotator cuff tear or rupture of right shoulder, not specified as traumatic: Secondary | ICD-10-CM | POA: Diagnosis not present

## 2024-01-15 DIAGNOSIS — M7541 Impingement syndrome of right shoulder: Secondary | ICD-10-CM | POA: Diagnosis not present

## 2024-01-15 DIAGNOSIS — M7542 Impingement syndrome of left shoulder: Secondary | ICD-10-CM | POA: Diagnosis not present

## 2024-01-16 ENCOUNTER — Other Ambulatory Visit: Payer: Self-pay

## 2024-01-16 ENCOUNTER — Encounter (HOSPITAL_BASED_OUTPATIENT_CLINIC_OR_DEPARTMENT_OTHER): Payer: Self-pay | Admitting: Orthopedic Surgery

## 2024-01-23 ENCOUNTER — Encounter (HOSPITAL_BASED_OUTPATIENT_CLINIC_OR_DEPARTMENT_OTHER): Payer: Self-pay | Admitting: Orthopedic Surgery

## 2024-01-23 ENCOUNTER — Other Ambulatory Visit: Payer: Self-pay

## 2024-01-23 ENCOUNTER — Ambulatory Visit (HOSPITAL_BASED_OUTPATIENT_CLINIC_OR_DEPARTMENT_OTHER)
Admission: RE | Admit: 2024-01-23 | Discharge: 2024-01-23 | Disposition: A | Discharge status: Home / Self Care | Attending: Orthopedic Surgery | Admitting: Orthopedic Surgery

## 2024-01-23 ENCOUNTER — Ambulatory Visit (HOSPITAL_BASED_OUTPATIENT_CLINIC_OR_DEPARTMENT_OTHER): Payer: Self-pay | Admitting: Certified Registered"

## 2024-01-23 ENCOUNTER — Encounter (HOSPITAL_BASED_OUTPATIENT_CLINIC_OR_DEPARTMENT_OTHER)
Admission: RE | Disposition: A | Discharge status: Home / Self Care | Payer: Self-pay | Source: Home / Self Care | Attending: Orthopedic Surgery

## 2024-01-23 DIAGNOSIS — M75121 Complete rotator cuff tear or rupture of right shoulder, not specified as traumatic: Secondary | ICD-10-CM | POA: Diagnosis not present

## 2024-01-23 DIAGNOSIS — X58XXXA Exposure to other specified factors, initial encounter: Secondary | ICD-10-CM | POA: Insufficient documentation

## 2024-01-23 DIAGNOSIS — S46111A Strain of muscle, fascia and tendon of long head of biceps, right arm, initial encounter: Secondary | ICD-10-CM | POA: Diagnosis not present

## 2024-01-23 DIAGNOSIS — S46011A Strain of muscle(s) and tendon(s) of the rotator cuff of right shoulder, initial encounter: Secondary | ICD-10-CM | POA: Insufficient documentation

## 2024-01-23 DIAGNOSIS — M7521 Bicipital tendinitis, right shoulder: Secondary | ICD-10-CM | POA: Diagnosis not present

## 2024-01-23 DIAGNOSIS — Z79899 Other long term (current) drug therapy: Secondary | ICD-10-CM | POA: Diagnosis not present

## 2024-01-23 DIAGNOSIS — S46211D Strain of muscle, fascia and tendon of other parts of biceps, right arm, subsequent encounter: Secondary | ICD-10-CM | POA: Diagnosis not present

## 2024-01-23 DIAGNOSIS — K219 Gastro-esophageal reflux disease without esophagitis: Secondary | ICD-10-CM | POA: Insufficient documentation

## 2024-01-23 DIAGNOSIS — M7541 Impingement syndrome of right shoulder: Secondary | ICD-10-CM | POA: Insufficient documentation

## 2024-01-23 DIAGNOSIS — S43431D Superior glenoid labrum lesion of right shoulder, subsequent encounter: Secondary | ICD-10-CM | POA: Diagnosis not present

## 2024-01-23 DIAGNOSIS — G8918 Other acute postprocedural pain: Secondary | ICD-10-CM | POA: Diagnosis not present

## 2024-01-23 DIAGNOSIS — Z87891 Personal history of nicotine dependence: Secondary | ICD-10-CM | POA: Diagnosis not present

## 2024-01-23 DIAGNOSIS — Z01818 Encounter for other preprocedural examination: Secondary | ICD-10-CM

## 2024-01-23 HISTORY — DX: Gastro-esophageal reflux disease without esophagitis: K21.9

## 2024-01-23 HISTORY — PX: SHOULDER ARTHROSCOPY WITH ROTATOR CUFF REPAIR: SHX5685

## 2024-01-23 SURGERY — ARTHROSCOPY, SHOULDER, WITH ROTATOR CUFF REPAIR
Anesthesia: Regional | Site: Shoulder | Laterality: Right

## 2024-01-23 MED ORDER — ROCURONIUM BROMIDE 100 MG/10ML IV SOLN
INTRAVENOUS | Status: DC | PRN
  Administered 2024-01-23: 60 mg via INTRAVENOUS

## 2024-01-23 MED ORDER — CEFAZOLIN SODIUM-DEXTROSE 2-4 GM/100ML-% IV SOLN
2.0000 g | INTRAVENOUS | Status: AC
  Administered 2024-01-23: 2 g via INTRAVENOUS

## 2024-01-23 MED ORDER — CEFAZOLIN SODIUM-DEXTROSE 2-4 GM/100ML-% IV SOLN
INTRAVENOUS | Status: AC
  Filled 2024-01-23: qty 100

## 2024-01-23 MED ORDER — DEXAMETHASONE SODIUM PHOSPHATE 4 MG/ML IJ SOLN
INTRAMUSCULAR | Status: DC | PRN
  Administered 2024-01-23: 8 mg via INTRAVENOUS

## 2024-01-23 MED ORDER — PROPOFOL 10 MG/ML IV BOLUS
INTRAVENOUS | Status: AC
  Filled 2024-01-23: qty 20

## 2024-01-23 MED ORDER — LABETALOL HCL 5 MG/ML IV SOLN
INTRAVENOUS | Status: DC | PRN
  Administered 2024-01-23: 2.5 mg via INTRAVENOUS
  Administered 2024-01-23: 5 mg via INTRAVENOUS

## 2024-01-23 MED ORDER — BUPIVACAINE-EPINEPHRINE (PF) 0.5% -1:200000 IJ SOLN
INTRAMUSCULAR | Status: DC | PRN
Start: 2024-01-23 — End: 2024-01-23
  Administered 2024-01-23: 15 mL via PERINEURAL

## 2024-01-23 MED ORDER — BUPIVACAINE LIPOSOME 1.3 % IJ SUSP
INTRAMUSCULAR | Status: DC | PRN
  Administered 2024-01-23: 133 mg via PERINEURAL

## 2024-01-23 MED ORDER — MIDAZOLAM HCL 2 MG/2ML IJ SOLN
INTRAMUSCULAR | Status: AC
  Filled 2024-01-23: qty 2

## 2024-01-23 MED ORDER — KETOROLAC TROMETHAMINE 30 MG/ML IJ SOLN
INTRAMUSCULAR | Status: DC | PRN
  Administered 2024-01-23: 30 mg via INTRAVENOUS

## 2024-01-23 MED ORDER — LABETALOL HCL 5 MG/ML IV SOLN
INTRAVENOUS | Status: AC
  Filled 2024-01-23: qty 4

## 2024-01-23 MED ORDER — OXYCODONE HCL 5 MG PO TABS
ORAL_TABLET | ORAL | Status: AC
  Filled 2024-01-23: qty 1

## 2024-01-23 MED ORDER — PROPOFOL 500 MG/50ML IV EMUL
INTRAVENOUS | Status: AC
  Filled 2024-01-23: qty 50

## 2024-01-23 MED ORDER — ACETAMINOPHEN 10 MG/ML IV SOLN: 1000.0000 mg | Freq: Once | INTRAVENOUS | Status: DC | PRN

## 2024-01-23 MED ORDER — FENTANYL CITRATE (PF) 100 MCG/2ML IJ SOLN: 25.0000 ug | INTRAMUSCULAR | Status: DC | PRN

## 2024-01-23 MED ORDER — ACETAMINOPHEN 160 MG/5ML PO SOLN: 1000.0000 mg | Freq: Once | ORAL | Status: DC | PRN

## 2024-01-23 MED ORDER — DEXAMETHASONE SODIUM PHOSPHATE 10 MG/ML IJ SOLN
INTRAMUSCULAR | Status: AC
Start: 2024-01-23 — End: ?
  Filled 2024-01-23: qty 2

## 2024-01-23 MED ORDER — OXYCODONE HCL 5 MG/5ML PO SOLN: 5.0000 mg | Freq: Once | ORAL | Status: AC | PRN

## 2024-01-23 MED ORDER — MIDAZOLAM HCL 2 MG/2ML IJ SOLN
2.0000 mg | Freq: Once | INTRAMUSCULAR | Status: AC
  Administered 2024-01-23: 2 mg via INTRAVENOUS

## 2024-01-23 MED ORDER — CEFAZOLIN SODIUM 1 G IJ SOLR
INTRAMUSCULAR | Status: AC
  Filled 2024-01-23: qty 10

## 2024-01-23 MED ORDER — SUGAMMADEX SODIUM 200 MG/2ML IV SOLN
INTRAVENOUS | Status: DC | PRN
  Administered 2024-01-23: 200 mg via INTRAVENOUS

## 2024-01-23 MED ORDER — ACETAMINOPHEN 500 MG PO TABS: 1000.0000 mg | ORAL_TABLET | Freq: Once | ORAL | Status: DC | PRN

## 2024-01-23 MED ORDER — FENTANYL CITRATE (PF) 100 MCG/2ML IJ SOLN
INTRAMUSCULAR | Status: AC
  Filled 2024-01-23: qty 2

## 2024-01-23 MED ORDER — PROPOFOL 10 MG/ML IV BOLUS
INTRAVENOUS | Status: DC | PRN
  Administered 2024-01-23: 160 mg via INTRAVENOUS
  Administered 2024-01-23: 40 mg via INTRAVENOUS

## 2024-01-23 MED ORDER — ONDANSETRON HCL 4 MG/2ML IJ SOLN
INTRAMUSCULAR | Status: AC
Start: 2024-01-23 — End: ?
  Filled 2024-01-23: qty 6

## 2024-01-23 MED ORDER — ONDANSETRON 4 MG PO TBDP: 4.0000 mg | ORAL_TABLET | Freq: Three times a day (TID) | ORAL | 0 refills | Status: DC | PRN

## 2024-01-23 MED ORDER — FENTANYL CITRATE (PF) 100 MCG/2ML IJ SOLN
100.0000 ug | Freq: Once | INTRAMUSCULAR | Status: AC
  Administered 2024-01-23: 50 ug via INTRAVENOUS

## 2024-01-23 MED ORDER — OXYCODONE HCL 5 MG PO TABS: 5.0000 mg | ORAL_TABLET | Freq: Four times a day (QID) | ORAL | 0 refills | Status: DC | PRN

## 2024-01-23 MED ORDER — FENTANYL CITRATE (PF) 100 MCG/2ML IJ SOLN
INTRAMUSCULAR | Status: AC
Start: 2024-01-23 — End: ?
  Filled 2024-01-23: qty 2

## 2024-01-23 MED ORDER — FENTANYL CITRATE (PF) 100 MCG/2ML IJ SOLN
INTRAMUSCULAR | Status: DC | PRN
  Administered 2024-01-23 (×4): 25 ug via INTRAVENOUS

## 2024-01-23 MED ORDER — OXYCODONE HCL 5 MG PO TABS
5.0000 mg | ORAL_TABLET | Freq: Once | ORAL | Status: AC | PRN
  Administered 2024-01-23: 5 mg via ORAL

## 2024-01-23 MED ORDER — LACTATED RINGERS IV SOLN: INTRAVENOUS | Status: DC

## 2024-01-23 MED ORDER — SODIUM CHLORIDE 0.9 % IR SOLN
Status: DC | PRN
  Administered 2024-01-23: 6000 mL

## 2024-01-23 MED ORDER — ACETAMINOPHEN 10 MG/ML IV SOLN
INTRAVENOUS | Status: DC | PRN
  Administered 2024-01-23: 1000 mg via INTRAVENOUS

## 2024-01-23 MED ORDER — ONDANSETRON HCL 4 MG/2ML IJ SOLN
INTRAMUSCULAR | Status: DC | PRN
  Administered 2024-01-23: 4 mg via INTRAVENOUS

## 2024-01-23 MED ORDER — SODIUM CHLORIDE 0.9 % IR SOLN
Status: DC | PRN
  Administered 2024-01-23: 9000 mL

## 2024-01-23 SURGICAL SUPPLY — 49 items
ANCHOR SUT BIO SW 4.75X19.1 (Anchor) IMPLANT
BNDG COHESIVE 4X5 TAN STRL LF (GAUZE/BANDAGES/DRESSINGS) IMPLANT
BURR OVAL 8 FLU 4.0X13 (MISCELLANEOUS) IMPLANT
CANNULA 5.75X71 LONG (CANNULA) IMPLANT
CANNULA PASSPORT BUTTON 10-40 (CANNULA) IMPLANT
CANNULA TWIST IN 8.25X7CM (CANNULA) IMPLANT
COOLER ICEMAN CLASSIC (MISCELLANEOUS) ×1 IMPLANT
CUTTER BONE 4.0MM X 13CM (MISCELLANEOUS) ×1 IMPLANT
DRAPE INCISE IOBAN 66X45 STRL (DRAPES) ×1 IMPLANT
DRAPE STERI 35X30 U-POUCH (DRAPES) ×1 IMPLANT
DRAPE SURG 17X23 STRL (DRAPES) ×1 IMPLANT
DRAPE U-SHAPE 47X51 STRL (DRAPES) ×1 IMPLANT
DRAPE U-SHAPE 76X120 STRL (DRAPES) ×2 IMPLANT
DURAPREP 26ML APPLICATOR (WOUND CARE) ×1 IMPLANT
GAUZE PAD ABD 8X10 STRL (GAUZE/BANDAGES/DRESSINGS) ×2 IMPLANT
GAUZE SPONGE 4X4 12PLY STRL (GAUZE/BANDAGES/DRESSINGS) ×1 IMPLANT
GAUZE XEROFORM 1X8 LF (GAUZE/BANDAGES/DRESSINGS) IMPLANT
GLOVE BIO SURGEON STRL SZ7.5 (GLOVE) ×2 IMPLANT
GLOVE BIOGEL PI IND STRL 8 (GLOVE) ×2 IMPLANT
GOWN STRL REUS W/ TWL LRG LVL3 (GOWN DISPOSABLE) ×1 IMPLANT
GOWN STRL REUS W/TWL XL LVL3 (GOWN DISPOSABLE) ×2 IMPLANT
IMPL SPEEDBRIDGE KNOTLESS 4 (Anchor) IMPLANT
IMPLANT SPEEDBRIDGE KNOTLESS 4 (Anchor) ×1 IMPLANT
LOOP 2 FIBERLINK CLOSED (SUTURE) IMPLANT
MANIFOLD NEPTUNE II (INSTRUMENTS) ×1 IMPLANT
NDL HD SCORPION MEGA LOADER (NEEDLE) IMPLANT
NDL SAFETY ECLIPSE 18X1.5 (NEEDLE) ×1 IMPLANT
PACK ARTHROSCOPY DSU (CUSTOM PROCEDURE TRAY) ×1 IMPLANT
PACK BASIN DAY SURGERY FS (CUSTOM PROCEDURE TRAY) ×1 IMPLANT
PAD COLD SHLDR WRAP-ON (PAD) ×1 IMPLANT
PAD ORTHO SHOULDER 7X19 LRG (SOFTGOODS) IMPLANT
SLEEVE ARM SUSPENSION SYSTEM (MISCELLANEOUS) ×1 IMPLANT
SLEEVE SCD COMPRESS KNEE MED (STOCKING) ×1 IMPLANT
SLING S3 LATERAL DISP (MISCELLANEOUS) IMPLANT
SPIKE FLUID TRANSFER (MISCELLANEOUS) IMPLANT
STRIP CLOSURE SKIN 1/2X4 (GAUZE/BANDAGES/DRESSINGS) ×1 IMPLANT
SUT ETHILON 3 0 PS 1 (SUTURE) IMPLANT
SUT MNCRL AB 3-0 PS2 27 (SUTURE) ×1 IMPLANT
SUT PDS AB 0 CT 36 (SUTURE) IMPLANT
SUT VIC AB 0 CT1 36 (SUTURE) IMPLANT
SUT VIC AB 2-0 CT1 TAPERPNT 27 (SUTURE) IMPLANT
SUTURE TAPE TIGERLINK 1.3MM BL (SUTURE) IMPLANT
SUTURETAPE TIGERLINK 1.3MM BL (SUTURE) IMPLANT
SYR 5ML LL (SYRINGE) ×1 IMPLANT
TAPE FIBER 2MM 7IN #2 BLUE (SUTURE) IMPLANT
TOWEL GREEN STERILE FF (TOWEL DISPOSABLE) ×1 IMPLANT
TUBE CONNECTING 20X1/4 (TUBING) ×1 IMPLANT
TUBING ARTHROSCOPY IRRIG 16FT (MISCELLANEOUS) ×1 IMPLANT
WAND ABLATOR APOLLO I90 (BUR) ×1 IMPLANT

## 2024-01-23 NOTE — Discharge Instructions (Addendum)
 Orthopedic surgery discharge instructions:  -Maintain postoperative bandages for 3 days.  You may remove these bandages on post op day 3 and begin showering at that time.  Please do not submerge underwater.  -Maintain your arm in sling at all times.  You should only remove for showering and getting dressed.  No lifting with the operative arm.  -For mild to moderate pain use Tylenol and Advil in alternating fashion around-the-clock, every three hours each, respectively.  For breakthrough pain use oxycodone as necessary.  -Please apply ice to the right shoulder for 20-30 minutes out of each hour that you are awake.  If you have an ice machine, you may use this more frequently.  Do this around-the-clock for the first 3 days from surgery.  -Follow-up in 2 weeks for routine postoperative check.   Post Anesthesia Home Care Instructions  Activity: Get plenty of rest for the remainder of the day. A responsible individual must stay with you for 24 hours following the procedure.  For the next 24 hours, DO NOT: -Drive a car -Advertising copywriter -Drink alcoholic beverages -Take any medication unless instructed by your physician -Make any legal decisions or sign important papers.  Meals: Start with liquid foods such as gelatin or soup. Progress to regular foods as tolerated. Avoid greasy, spicy, heavy foods. If nausea and/or vomiting occur, drink only clear liquids until the nausea and/or vomiting subsides. Call your physician if vomiting continues.  Special Instructions/Symptoms: Your throat may feel dry or sore from the anesthesia or the breathing tube placed in your throat during surgery. If this causes discomfort, gargle with warm salt water. The discomfort should disappear within 24 hours.  If you had a scopolamine patch placed behind your ear for the management of post- operative nausea and/or vomiting:  1. The medication in the patch is effective for 72 hours, after which it should be  removed.  Wrap patch in a tissue and discard in the trash. Wash hands thoroughly with soap and water. 2. You may remove the patch earlier than 72 hours if you experience unpleasant side effects which may include dry mouth, dizziness or visual disturbances. 3. Avoid touching the patch. Wash your hands with soap and water after contact with the patch.  Regional Anesthesia Blocks  1. You may not be able to move or feel the "blocked" extremity after a regional anesthetic block. This may last may last from 3-48 hours after placement, but it will go away. The length of time depends on the medication injected and your individual response to the medication. As the nerves start to wake up, you may experience tingling as the movement and feeling returns to your extremity. If the numbness and inability to move your extremity has not gone away after 48 hours, please call your surgeon.   2. The extremity that is blocked will need to be protected until the numbness is gone and the strength has returned. Because you cannot feel it, you will need to take extra care to avoid injury. Because it may be weak, you may have difficulty moving it or using it. You may not know what position it is in without looking at it while the block is in effect.  3. For blocks in the legs and feet, returning to weight bearing and walking needs to be done carefully. You will need to wait until the numbness is entirely gone and the strength has returned. You should be able to move your leg and foot normally before you try and  bear weight or walk. You will need someone to be with you when you first try to ensure you do not fall and possibly risk injury.  4. Bruising and tenderness at the needle site are common side effects and will resolve in a few days.  5. Persistent numbness or new problems with movement should be communicated to the surgeon or the [facility] [phone] [facility] [phone]).     Information for Discharge Teaching: EXPAREL (bupivacaine liposome injectable suspension)   Pain relief is important to your recovery. The goal is to control your pain so you can move easier and return to your normal activities as soon as possible after your procedure. Your physician may use several types of medicines to manage pain, swelling, and more.  Your surgeon or anesthesiologist gave you EXPAREL(bupivacaine) to help control your pain after surgery.  EXPAREL is a local anesthetic designed to release slowly over an extended period of time to provide pain relief by numbing the tissue around the surgical site. EXPAREL is designed to release pain medication over time and can control pain for up to 72 hours. Depending on how you respond to EXPAREL, you may require less pain medication during your recovery. EXPAREL can help reduce or eliminate the need for opioids during the first few days after surgery when pain relief is needed the most. EXPAREL is not an opioid and is not addictive. It does not cause sleepiness or sedation.   Important! A teal colored band has been placed on your arm with the date, time and amount of EXPAREL you have received. Please leave this armband in place for the full 96 hours following administration, and then you may remove the band. If you return to the hospital for any reason within 96 hours following the administration of EXPAREL, the armband provides important information that your health care providers to know, and alerts them that you have received this anesthetic.    Possible side effects of EXPAREL: Temporary loss of sensation or ability to move in the area where medication was injected. Nausea, vomiting, constipation Rarely, numbness and tingling in your mouth or lips, lightheadedness, or anxiety may occur. Call your doctor right away if you think you may be experiencing any of these sensations, or if you have other questions regarding possible side  effects.  Follow all other discharge instructions given to you by your surgeon or nurse. Eat a healthy diet and drink plenty of water or other fluids.

## 2024-01-23 NOTE — Anesthesia Postprocedure Evaluation (Signed)
 Anesthesia Post Note  Patient: Joshua Ellison  Procedure(s) Performed: ARTHROSCOPY, SHOULDER, WITH ROTATOR CUFF REPAIR and BICEP TENODESIS (Right: Shoulder)     Patient location during evaluation: PACU Anesthesia Type: Regional and General Level of consciousness: awake and alert Pain management: pain level controlled Vital Signs Assessment: post-procedure vital signs reviewed and stable Respiratory status: spontaneous breathing, nonlabored ventilation and respiratory function stable Cardiovascular status: blood pressure returned to baseline and stable Postop Assessment: no apparent nausea or vomiting Anesthetic complications: no   No notable events documented.  Last Vitals:  Vitals:   01/23/24 1515 01/23/24 1530  BP: 137/85 (!) 141/92  Pulse: (!) 58 71  Resp: 13 16  Temp:  (!) 36.2 C  SpO2: 97% 94%    Last Pain:  Vitals:   01/23/24 1530  TempSrc:   PainSc: 3                  Lisle Skillman

## 2024-01-23 NOTE — Op Note (Signed)
 Date of Surgery: 01/23/2024  INDICATIONS: Mr. Joshua Ellison is a 63 y.o.-year-old male with a right acute traumatic massive 3 tendon rotator cuff tear following a trauma.  He is here today for arthroscopic repair.  He has been pseudo paretic and quite painful since the injury.;  The patient did consent to the procedure after discussion of the risks and benefits.  PREOPERATIVE DIAGNOSIS:  1.  Right shoulder massive acute traumatic rotator cuff tear 2.  Right shoulder proximal biceps tear 3.  Right shoulder subacromial impingement  POSTOPERATIVE DIAGNOSIS: Same.  PROCEDURE:  1.  Right shoulder extensive debridement arthroscopically of anterior labrum, superior labrum, rotator interval, subacromial bursa 2.  Right shoulder arthroscopic biceps tenodesis 3.  Right shoulder arthroscopic rotator cuff repair 4.  Right shoulder arthroscopic subacromial decompression  SURGEON: Maryan Rued, M.D.  ASSIST: Dion Saucier, PA-C  Assistant attestation:  PA Mcclung present for the entire procedure.  Scrubbed and present for all interval portions..  ANESTHESIA:  general, interscalene block  IV FLUIDS AND URINE: See anesthesia.  ESTIMATED BLOOD LOSS: 10 mL.  IMPLANTS: Arthrex 4.75 mm swivel lock anchor x 3 for subscapularis repair as well as lateral row fixation  Arthrex 2.6 mm rotator cuff fiber tack knotless anchor x 3 for medial row superior rotator cuff fixation  DRAINS: None  COMPLICATIONS: None.  DESCRIPTION OF PROCEDURE: The patient was brought to the operating room and placed supine on the operating table.  The patient had been signed prior to the procedure and this was documented. The patient had the anesthesia placed by the anesthesiologist.  A time-out was performed to confirm that this was the correct patient, site, side and location. The patient did receive antibiotics prior to the incision and was re-dosed during the procedure as needed at indicated intervals.  A tourniquet was not  placed.  The patient had the operative extremity prepped and draped in the standard surgical fashion.      After obtaining informed consent the patient was brought to the operating table and underwent satisfactory anesthesia. An exam under anesthesia revealed full range of motion. He was placed in the left lateral decubitus position with an axillary roll and all bony prominences properly padded. A standard surgical timeout was performed. He was placed in 10 pounds of gentle in-line suspension.  Standard posterior and anterior superior portals were established. A diagnostic evaluation of the glenohumeral joint was performed.  This was an acute appearing massive rotator cuff tear with the leading edge of the supraspinatus and infraspinatus level with the glenoid superiorly.  There was also complete disruption and medial retraction of about 2 cm of the subscapularis.  The biceps tendon was intact.  It was torn about a centimeter and a half off of it superior attachment.  There was degenerative tearing of the anterior and superior labrum as well.  No arthritis noted on the glenohumeral joint.  No loose bodies in the joint.  The biceps tendon was markedly synovitic and partially torn. It was secured with a luggage tag suture and then a penetrating suture through the midportion of the tendon high in the groove with a BirdBeak.  Tenotomized for later tenodesis. An extensive debridement was performed of the superior anterior and posterior labrum.  Rotator interval was widely released.  In the subacromial space we also widely excised the bursa.  The articular surfaces revealed no significant chondromalacia . The subscapularis was next repaired in arthroscopic fashion.  We utilized an Community education officer link for luggage tag style suture along  the superior border of the subscapularis.  This was passed through the tendon with a scorpion suture passer.  We then secured this to itself and placed this in a lesser tuberosity 4.75  mm swivel lock anchor.  . The rotator cuff was torn and the greater tuberosity footprint was prepared for repair. Releases were performed on the capsular surface of the rotator cuff.  The arthroscope was inserted in the subacromial space and an additional lateral portal was established. An acromioplasty performed nicely decompressing the subacromial space with a motorized burr. Bursitis in the subacromial space was removed as well as releases were performed on the bursal surface of the rotator cuff.   The subdeltoid space was entered and the biceps tendon was retrieved fromr the bicipital groove and the groove was decompressed.       Next cannulas were placed appropriately. Through an accessory lateral portal 3 separate 2.6 mm knotless medial row fiber tack anchors were placed at the articular margin.  The posterior 2 were utilized as medial row ripstop's as well.  We passed all sutures with a fiber link suture.  The posterior and middle medial row anchors were then interconnected with the knotless #2 suture through the knotless mechanism of the anchor.  This created a nice advancement of the tendon to the greater tuberosity.  This had excellent fixation there.  Anterior sutures were passed in standard fashion from the anterior medial row anchor.   Next we Deis wedged the fiber tapes.  We then divided these in half and prepared for our lateral row fixation.  Unfortunately, the anterior lateral row fixation did not seat and have appropriate purchase in the bone and the anchor did not seat all the way down and we lost that fixation point.  However we were able to get excellent fixation for the posterior lateral row anchor.  We also used a knotless mechanism from the posterior lateral 4.75 mm anchor to address loss of anterior  fixation with a #2 fiber wire through the anterior border.   The biceps tenodesis was completed by placing the sutures that previously secured these into the anterior lateral row  anchor.   The arthroscope was then removed and portals closed with 3-0 Monocryl in standard fashion followed by a sterile occlusive dressing Polar Care ice sleeve and a slingshot sling. The patient was sent to recovery in stable condition and tolerated the procedure well  POSTOPERATIVE PLAN:  He was awoken from general anesthesia in stable condition.  Transported to PACU.  He will recover in PACU and discharged home today.  He will be in a sling with abduction pillow until I see him in the office in 2 weeks.  He will be on the greater than 3 cm postoperative rotator cuff protocol.

## 2024-01-23 NOTE — Anesthesia Procedure Notes (Addendum)
 Procedure Name: Intubation Date/Time: 01/23/2024 12:58 PM  Performed by: Earmon Phoenix, CRNAPre-anesthesia Checklist: Patient identified, Emergency Drugs available, Suction available, Patient being monitored and Timeout performed Patient Re-evaluated:Patient Re-evaluated prior to induction Oxygen Delivery Method: Circle system utilized Preoxygenation: Pre-oxygenation with 100% oxygen Induction Type: IV induction Ventilation: Mask ventilation without difficulty Laryngoscope Size: Mac and 4 Grade View: Grade I Tube type: Oral Tube size: 7.5 mm Number of attempts: 1 Airway Equipment and Method: Stylet Placement Confirmation: ETT inserted through vocal cords under direct vision, positive ETCO2 and breath sounds checked- equal and bilateral Secured at: 23 cm Tube secured with: Tape Dental Injury: Teeth and Oropharynx as per pre-operative assessment

## 2024-01-23 NOTE — H&P (Signed)
 ORTHOPAEDIC H&P  REQUESTING PHYSICIAN: Yolonda Kida, MD  PCP:  Kristian Covey, MD  Chief Complaint: Right shoulder pain  HPI: Joshua Ellison is a 63 y.o. male who complains of right shoulder pain and weakness following a traumatic accident a few weeks back.  MRI did show a massive right shoulder rotator cuff tear.  This does appear acute and is here today for arthroscopic assisted repairs.  Past Medical History:  Diagnosis Date   GERD (gastroesophageal reflux disease)    SUPRAPUBIC PAIN 02/14/2009   Past Surgical History:  Procedure Laterality Date   COLONOSCOPY     INSERTION OF MESH N/A 10/20/2014   Procedure: INSERTION OF MESH;  Surgeon: Abigail Miyamoto, MD;  Location: [facility] OR;  Service: General;  Laterality: N/A;   KNEE ARTHROSCOPY W/ ACL RECONSTRUCTION Right 1995   POLYPECTOMY     TONSILLECTOMY  1970   UMBILICAL HERNIA REPAIR N/A 10/20/2014   Procedure: OPEN UMBILICAL HERNIA REPAIR WITH MESH;  Surgeon: Abigail Miyamoto, MD;  Location: [facility] OR;  Service: General;  Laterality: N/A;   VASECTOMY  2007   Social History   Socioeconomic History   Marital status: Married    Spouse name: Not on file   Number of children: Not on file   Years of education: Not on file   Highest education level: Not on file  Occupational History   Not on file  Tobacco Use   Smoking status: Former    Current packs/day: 0.00    Average packs/day: 2.0 packs/day for 27.0 years (54.0 ttl pk-yrs)    Types: Cigarettes    Start date: 11/04/1976    Quit date: 11/05/2003    Years since quitting: 20.2   Smokeless tobacco: Never  Vaping Use   Vaping status: Never Used  Substance and Sexual Activity   Alcohol use: Yes    Alcohol/week: 14.0 standard drinks of alcohol    Types: 14 Cans of beer per week    Comment: 2 beers per day    Drug use: No   Sexual activity: Yes  Other Topics Concern   Not on file  Social History Narrative   Not on file   Social Drivers of Health   Financial  Resource Strain: Not on file  Food Insecurity: Low Risk  (07/24/2023)   Received from [facility]   Hunger Vital Sign    Worried About Running Out of Food in the Last Year: Never true    Ran Out of Food in the Last Year: Never true  Transportation Needs: Not on file  Physical Activity: Not on file  Stress: Not on file  Social Connections: Not on file   Family History  Problem Relation Age of Onset   Hyperlipidemia Mother    Diabetes Father    Bladder Cancer Father    Stomach cancer Maternal Grandmother    COPD Paternal Grandmother    Prostate cancer Paternal Grandfather    Colon cancer Neg Hx    Colon polyps Neg Hx    Esophageal cancer Neg Hx    Rectal cancer Neg Hx    Pancreatic cancer Neg Hx    Breast cancer Neg Hx    Crohn's disease Neg Hx    Ulcerative colitis Neg Hx    No Known Allergies Prior to Admission medications   Medication Sig Start Date End Date Taking? Authorizing Provider  Multiple Vitamins-Minerals (HAIR SKIN AND NAILS FORMULA) TABS Take by mouth.   Yes [provider]  NAPROXEN PO Take  by mouth.   Yes [provider]  tadalafil (CIALIS) 20 MG tablet Take one tablet by mouth every other day as needed for erectile dysfunction. 02/26/23  Yes Worthy Rancher B, FNP  traMADol (ULTRAM) 50 MG tablet Take 50 mg by mouth every 6 (six) hours as needed.   Yes [provider]   No results found.  Positive ROS: All other systems have been reviewed and were otherwise negative with the exception of those mentioned in the HPI and as above.  Physical Exam: General: Alert, no acute distress Cardiovascular: No pedal edema Respiratory: No cyanosis, no use of accessory musculature GI: No organomegaly, abdomen is soft and non-tender Skin: No lesions in the area of chief complaint Neurologic: Sensation intact distally Psychiatric: Patient is competent for consent with normal mood and affect Lymphatic: No axillary or cervical  lymphadenopathy  MUSCULOSKELETAL: Right upper extremity is warm and well-perfused with no open wounds or lesions.  He is neurovascular intact.  Assessment: 1.  Right shoulder acute traumatic rotator cuff tear 2.  Right shoulder acute traumatic biceps instability 3.  Right shoulder subacromial impingement  Plan: Plan to proceed today with arthroscopic assisted right shoulder rotator cuff repair with debridements, and biceps tenodesis.  We discussed again the indications for the procedure as well as the risk and benefits.  He is provided informed consent.  Plan for discharge home postop from PACU.    Yolonda Kida, MD Cell [phone]    01/23/2024 10:14 AM

## 2024-01-23 NOTE — Anesthesia Preprocedure Evaluation (Addendum)
 Anesthesia Evaluation  Patient identified by MRN, date of birth, ID band Patient awake    Reviewed: Allergy & Precautions, NPO status , Patient's Chart, lab work & pertinent test results  History of Anesthesia Complications Negative for: history of anesthetic complications  Airway Mallampati: III  TM Distance: >3 FB Neck ROM: Full    Dental  (+) Teeth Intact, Dental Advisory Given   Pulmonary neg shortness of breath, neg sleep apnea, neg COPD, neg recent URI, former smoker   breath sounds clear to auscultation       Cardiovascular  Rhythm:Regular  Diastolic elevated today, states this is normal for MD/DDS visits. Will monitor and treat as needed   Neuro/Psych negative neurological ROS  negative psych ROS   GI/Hepatic Neg liver ROS,GERD  Medicated and Controlled,,  Endo/Other  negative endocrine ROS    Renal/GU negative Renal ROS     Musculoskeletal Impingement syndrome of right shoulder   Abdominal   Peds  Hematology negative hematology ROS (+) Lab Results      Component                Value               Date                      WBC                      6.3                 08/05/2022                HGB                      14.3                08/05/2022                HCT                      41.7                08/05/2022                MCV                      90.7                08/05/2022                PLT                      209.0               08/05/2022              Anesthesia Other Findings   Reproductive/Obstetrics                             Anesthesia Physical Anesthesia Plan  ASA: 1  Anesthesia Plan: Regional and General   Post-op Pain Management: Regional block*   Induction: Intravenous  PONV Risk Score and Plan: 2 and Ondansetron and Dexamethasone  Airway Management Planned: Oral ETT  Additional Equipment: None  Intra-op Plan:   Post-operative Plan:  Extubation in OR  Informed Consent: I have reviewed the patients History and Physical,  chart, labs and discussed the procedure including the risks, benefits and alternatives for the proposed anesthesia with the patient or authorized representative who has indicated his/her understanding and acceptance.     Dental advisory given  Plan Discussed with: CRNA  Anesthesia Plan Comments:        Anesthesia Quick Evaluation

## 2024-01-23 NOTE — Anesthesia Procedure Notes (Signed)
 Anesthesia Regional Block: Interscalene brachial plexus block   Pre-Anesthetic Checklist: , timeout performed,  Correct Patient, Correct Site, Correct Laterality,  Correct Procedure, Correct Position, site marked,  Risks and benefits discussed,  Surgical consent,  Pre-op evaluation,  At surgeon's request and post-op pain management  Laterality: Right and Upper  Prep: chloraprep       Needles:  Injection technique: Single-shot      Needle Length: 5cm  Needle Gauge: 22     Additional Needles: Arrow StimuQuik ECHO Echogenic Stimulating PNB Needle  Procedures:,,,, ultrasound used (permanent image in chart),,    Narrative:  Start time: 01/23/2024 12:09 PM End time: 01/23/2024 12:15 PM Injection made incrementally with aspirations every 5 mL.  Performed by: Personally  Anesthesiologist: Val Eagle, MD

## 2024-01-23 NOTE — Brief Op Note (Signed)
 01/23/2024  2:30 PM  PATIENT:  Joshua Ellison  63 y.o. male  PRE-OPERATIVE DIAGNOSIS:  Impingement syndrome of right shoulder  POST-OPERATIVE DIAGNOSIS:  Impingement syndrome of right shoulder  PROCEDURE:  Procedure(s) with comments: ARTHROSCOPY, SHOULDER, WITH ROTATOR CUFF REPAIR and BICEP TENODESIS (Right) - biceps tenodesis and subacromial decompression  SURGEON:  Surgeons and Role:    * Yolonda Kida, MD - Primary  PHYSICIAN ASSISTANT: Dion Saucier, PA-C   ANESTHESIA:   regional and general  EBL: 10 cc  BLOOD ADMINISTERED:none  DRAINS: none   LOCAL MEDICATIONS USED:  NONE  SPECIMEN:  No Specimen  DISPOSITION OF SPECIMEN:  N/A  COUNTS:  YES  TOURNIQUET:  * No tourniquets in log *  DICTATION: .Note written in EPIC  PLAN OF CARE: Discharge to home after PACU  PATIENT DISPOSITION:  PACU - hemodynamically stable.   Delay start of Pharmacological VTE agent (>24hrs) due to surgical blood loss or risk of bleeding: not applicable

## 2024-01-23 NOTE — Progress Notes (Signed)
Assisted Dr. Moser with right, interscalene , ultrasound guided block. Side rails up, monitors on throughout procedure. See vital signs in flow sheet. Tolerated Procedure well. 

## 2024-01-23 NOTE — Transfer of Care (Signed)
 Immediate Anesthesia Transfer of Care Note  Patient: Joshua Ellison  Procedure(s) Performed: ARTHROSCOPY, SHOULDER, WITH ROTATOR CUFF REPAIR and BICEP TENODESIS (Right: Shoulder)  Patient Location: PACU  Anesthesia Type:General  Level of Consciousness: awake, alert , oriented, and patient cooperative  Airway & Oxygen Therapy: Patient Spontanous Breathing and Patient connected to nasal cannula oxygen  Post-op Assessment: Report given to RN and Post -op Vital signs reviewed and stable  Post vital signs: Reviewed and stable  Last Vitals:  Vitals Value Taken Time  BP 102/83 01/23/24 1453  Temp    Pulse 72 01/23/24 1456  Resp    SpO2 96 % 01/23/24 1456  Vitals shown include unfiled device data.  Last Pain:  Vitals:   01/23/24 1039  TempSrc: Oral  PainSc: 2       Patients Stated Pain Goal: 6 (01/23/24 1039)  Complications: No notable events documented.

## 2024-01-26 ENCOUNTER — Encounter (HOSPITAL_BASED_OUTPATIENT_CLINIC_OR_DEPARTMENT_OTHER): Payer: Self-pay | Admitting: Orthopedic Surgery

## 2024-02-02 DIAGNOSIS — M25511 Pain in right shoulder: Secondary | ICD-10-CM | POA: Diagnosis not present

## 2024-02-02 DIAGNOSIS — M25611 Stiffness of right shoulder, not elsewhere classified: Secondary | ICD-10-CM | POA: Diagnosis not present

## 2024-02-02 DIAGNOSIS — M6281 Muscle weakness (generalized): Secondary | ICD-10-CM | POA: Diagnosis not present

## 2024-02-04 DIAGNOSIS — M6281 Muscle weakness (generalized): Secondary | ICD-10-CM | POA: Diagnosis not present

## 2024-02-04 DIAGNOSIS — M25511 Pain in right shoulder: Secondary | ICD-10-CM | POA: Diagnosis not present

## 2024-02-04 DIAGNOSIS — M25611 Stiffness of right shoulder, not elsewhere classified: Secondary | ICD-10-CM | POA: Diagnosis not present

## 2024-02-06 DIAGNOSIS — M25611 Stiffness of right shoulder, not elsewhere classified: Secondary | ICD-10-CM | POA: Diagnosis not present

## 2024-02-06 DIAGNOSIS — M6281 Muscle weakness (generalized): Secondary | ICD-10-CM | POA: Diagnosis not present

## 2024-02-06 DIAGNOSIS — M25511 Pain in right shoulder: Secondary | ICD-10-CM | POA: Diagnosis not present

## 2024-02-09 DIAGNOSIS — M25511 Pain in right shoulder: Secondary | ICD-10-CM | POA: Diagnosis not present

## 2024-02-09 DIAGNOSIS — M25611 Stiffness of right shoulder, not elsewhere classified: Secondary | ICD-10-CM | POA: Diagnosis not present

## 2024-02-09 DIAGNOSIS — M6281 Muscle weakness (generalized): Secondary | ICD-10-CM | POA: Diagnosis not present

## 2024-02-19 DIAGNOSIS — M25511 Pain in right shoulder: Secondary | ICD-10-CM | POA: Diagnosis not present

## 2024-02-19 DIAGNOSIS — M25611 Stiffness of right shoulder, not elsewhere classified: Secondary | ICD-10-CM | POA: Diagnosis not present

## 2024-02-19 DIAGNOSIS — M6281 Muscle weakness (generalized): Secondary | ICD-10-CM | POA: Diagnosis not present

## 2024-02-27 ENCOUNTER — Emergency Department (HOSPITAL_BASED_OUTPATIENT_CLINIC_OR_DEPARTMENT_OTHER)

## 2024-02-27 ENCOUNTER — Emergency Department (HOSPITAL_BASED_OUTPATIENT_CLINIC_OR_DEPARTMENT_OTHER)
Admission: EM | Admit: 2024-02-27 | Discharge: 2024-02-27 | Disposition: A | Discharge status: Home / Self Care | Attending: Emergency Medicine | Admitting: Emergency Medicine

## 2024-02-27 ENCOUNTER — Other Ambulatory Visit: Payer: Self-pay

## 2024-02-27 ENCOUNTER — Emergency Department (HOSPITAL_BASED_OUTPATIENT_CLINIC_OR_DEPARTMENT_OTHER): Admitting: Radiology

## 2024-02-27 DIAGNOSIS — D72829 Elevated white blood cell count, unspecified: Secondary | ICD-10-CM | POA: Insufficient documentation

## 2024-02-27 DIAGNOSIS — R61 Generalized hyperhidrosis: Secondary | ICD-10-CM | POA: Insufficient documentation

## 2024-02-27 DIAGNOSIS — J439 Emphysema, unspecified: Secondary | ICD-10-CM | POA: Diagnosis not present

## 2024-02-27 DIAGNOSIS — M25512 Pain in left shoulder: Secondary | ICD-10-CM | POA: Diagnosis not present

## 2024-02-27 DIAGNOSIS — K449 Diaphragmatic hernia without obstruction or gangrene: Secondary | ICD-10-CM | POA: Diagnosis not present

## 2024-02-27 DIAGNOSIS — M546 Pain in thoracic spine: Secondary | ICD-10-CM | POA: Insufficient documentation

## 2024-02-27 DIAGNOSIS — R079 Chest pain, unspecified: Secondary | ICD-10-CM | POA: Insufficient documentation

## 2024-02-27 DIAGNOSIS — M549 Dorsalgia, unspecified: Secondary | ICD-10-CM

## 2024-02-27 DIAGNOSIS — R9431 Abnormal electrocardiogram [ECG] [EKG]: Secondary | ICD-10-CM | POA: Diagnosis not present

## 2024-02-27 LAB — COMPREHENSIVE METABOLIC PANEL WITH GFR
ALT: 18 U/L (ref 0–44)
AST: 14 U/L — ABNORMAL LOW (ref 15–41)
Albumin: 4.7 g/dL (ref 3.5–5.0)
Alkaline Phosphatase: 83 U/L (ref 38–126)
Anion gap: 13 (ref 5–15)
BUN: 19 mg/dL (ref 8–23)
CO2: 20 mmol/L — ABNORMAL LOW (ref 22–32)
Calcium: 10.1 mg/dL (ref 8.9–10.3)
Chloride: 103 mmol/L (ref 98–111)
Creatinine, Ser: 1.04 mg/dL (ref 0.61–1.24)
GFR, Estimated: >60 mL/min (ref >60)
Glucose, Bld: 138 mg/dL — ABNORMAL HIGH (ref 70–99)
Potassium: 4.7 mmol/L (ref 3.5–5.1)
Sodium: 136 mmol/L (ref 135–145)
Total Bilirubin: 0.4 mg/dL (ref 0.0–1.2)
Total Protein: 7.1 g/dL (ref 6.5–8.1)

## 2024-02-27 LAB — TROPONIN T, HIGH SENSITIVITY
Troponin T High Sensitivity: <15 ng/L (ref <19)
Troponin T High Sensitivity: <15 ng/L (ref <19)

## 2024-02-27 LAB — CBC
HCT: 40.5 % (ref 39.0–52.0)
Hemoglobin: 13.7 g/dL (ref 13.0–17.0)
MCH: 30.3 pg (ref 26.0–34.0)
MCHC: 33.8 g/dL (ref 30.0–36.0)
MCV: 89.6 fL (ref 80.0–100.0)
Platelets: 275 10*3/uL (ref 150–400)
RBC: 4.52 MIL/uL (ref 4.22–5.81)
RDW: 12.5 % (ref 11.5–15.5)
WBC: 12 10*3/uL — ABNORMAL HIGH (ref 4.0–10.5)
nRBC: 0 % (ref 0.0–0.2)

## 2024-02-27 LAB — PRO BRAIN NATRIURETIC PEPTIDE: Pro Brain Natriuretic Peptide: <36 pg/mL (ref <300.0)

## 2024-02-27 LAB — PROTIME-INR
INR: 0.9 (ref 0.8–1.2)
Prothrombin Time: 12.8 s (ref 11.4–15.2)

## 2024-02-27 LAB — LACTIC ACID, PLASMA: Lactic Acid, Venous: 1.7 mmol/L (ref 0.5–1.9)

## 2024-02-27 MED ORDER — FENTANYL CITRATE PF 50 MCG/ML IJ SOSY
50.0000 ug | PREFILLED_SYRINGE | Freq: Once | INTRAMUSCULAR | Status: AC
  Administered 2024-02-27: 50 ug via INTRAVENOUS
  Filled 2024-02-27: qty 1

## 2024-02-27 MED ORDER — IOHEXOL 350 MG/ML SOLN
75.0000 mL | Freq: Once | INTRAVENOUS | Status: AC | PRN
  Administered 2024-02-27: 75 mL via INTRAVENOUS

## 2024-02-27 MED ORDER — ONDANSETRON HCL 4 MG/2ML IJ SOLN
4.0000 mg | Freq: Once | INTRAMUSCULAR | Status: AC
  Administered 2024-02-27: 4 mg via INTRAVENOUS
  Filled 2024-02-27: qty 2

## 2024-02-27 MED ORDER — OXYCODONE-ACETAMINOPHEN 5-325 MG PO TABS
1.0000 | ORAL_TABLET | Freq: Once | ORAL | Status: AC
  Administered 2024-02-27: 1 via ORAL
  Filled 2024-02-27: qty 1

## 2024-02-27 MED ORDER — SODIUM CHLORIDE 0.9 % IV BOLUS
1000.0000 mL | Freq: Once | INTRAVENOUS | Status: AC
  Administered 2024-02-27: 1000 mL via INTRAVENOUS

## 2024-02-27 MED ORDER — OXYCODONE-ACETAMINOPHEN 5-325 MG PO TABS
1.0000 | ORAL_TABLET | Freq: Four times a day (QID) | ORAL | 0 refills | Status: DC | PRN
Start: 2024-02-27 — End: 2024-08-03

## 2024-02-27 MED ORDER — KETOROLAC TROMETHAMINE 30 MG/ML IJ SOLN
30.0000 mg | Freq: Once | INTRAMUSCULAR | Status: AC
  Administered 2024-02-27: 30 mg via INTRAVENOUS
  Filled 2024-02-27: qty 1

## 2024-02-27 NOTE — ED Triage Notes (Signed)
 5 weeks post op right shoulder repair. Reports left scapular. Pain now across thoracic back. SOB. NO thinners. Took tramadol and cyclobenziprine.

## 2024-02-27 NOTE — Discharge Instructions (Signed)
 You were seen in the emergency department for left-sided upper back pain.  You had blood work EKG chest x-ray and a CAT scan of your chest that did not show an obvious explanation for your symptoms.  We are treating you with some narcotic pain medication.  Please continue the muscle spasm medicine.  Follow-up with your orthopedic doctor.  Return if any worsening or concerning symptoms.

## 2024-02-27 NOTE — ED Notes (Signed)
 RT Note: Patient breath sounds are clear but he is diaphoretic and having trouble deep breathing. Spo2  is currently 98% at this time on room air. Patient is on the monitor and awaiting test.

## 2024-02-27 NOTE — ED Provider Notes (Signed)
 [place] EMERGENCY DEPARTMENT AT [facility] Provider Note   CSN: [id number] Arrival date & time: 02/27/24  1547     History  No chief complaint on file.   Joshua Ellison is a 63 y.o. male.  He had right rotator cuff repair surgery about 5 weeks ago.  He started having some left scapular pain last night which acutely worsened today.  Pain is worse with movement and taking a deep breath.  Called his orthopedist and was prescribed Flexeril  and tramadol which she has taken.  Patient was diaphoretic and near syncopal trying to get into here for further evaluation.  No history of cardiac disease.  No chest pain at this time.  No numbness or weakness.  The history is provided by the patient.  Back Pain Pain location: left scapular. Quality:  Stabbing Pain severity:  Severe Pain is:  Same all the time Onset quality:  Gradual Duration:  24 hours Timing:  Constant Progression:  Worsening Chronicity:  New Relieved by:  Nothing Worsened by:  Deep breathing and movement Associated symptoms: no abdominal pain, no chest pain, no dysuria, no fever, no headaches, no numbness and no weakness   Risk factors: recent surgery        Home Medications Prior to Admission medications   Medication Sig Start Date End Date Taking? Authorizing Provider  Multiple Vitamins-Minerals (HAIR SKIN AND NAILS FORMULA) TABS Take by mouth.    [provider]  NAPROXEN PO Take by mouth.    [provider]  ondansetron  (ZOFRAN -ODT) 4 MG disintegrating tablet Take 1 tablet (4 mg total) by mouth every 8 (eight) hours as needed for nausea or vomiting. 01/23/24   Janeth Medicus, MD  oxyCODONE  (ROXICODONE ) 5 MG immediate release tablet Take 1 tablet (5 mg total) by mouth every 6 (six) hours as needed for moderate pain (pain score 4-6). 01/23/24 01/22/25  Janeth Medicus, MD  tadalafil  (CIALIS ) 20 MG tablet Take one tablet by mouth every other day as needed for erectile dysfunction.  02/26/23   Webb, Padonda B, FNP  traMADol (ULTRAM) 50 MG tablet Take 50 mg by mouth every 6 (six) hours as needed.    [provider]      Allergies    Patient has no known allergies.    Review of Systems   Review of Systems  Constitutional:  Positive for diaphoresis. Negative for fever.  HENT:  Negative for sore throat.   Respiratory:  Positive for shortness of breath.   Cardiovascular:  Negative for chest pain.  Gastrointestinal:  Negative for abdominal pain.  Genitourinary:  Negative for dysuria.  Musculoskeletal:  Positive for back pain.  Skin:  Negative for rash.  Neurological:  Negative for weakness, numbness and headaches.    Physical Exam Updated Vital Signs BP (!) 84/67   Pulse 79   Temp 98.5 F (36.9 C)   Resp 18   SpO2 97%  Physical Exam Vitals and nursing note reviewed.  Constitutional:      General: He is not in acute distress.    Appearance: He is well-developed. He is diaphoretic.  HENT:     Head: Normocephalic and atraumatic.  Eyes:     Conjunctiva/sclera: Conjunctivae normal.  Cardiovascular:     Rate and Rhythm: Normal rate and regular rhythm.     Heart sounds: No murmur heard. Pulmonary:     Effort: Pulmonary effort is normal. No respiratory distress.     Breath sounds: Normal breath sounds.  Abdominal:  Palpations: Abdomen is soft.     Tenderness: There is no abdominal tenderness. There is no guarding or rebound.  Musculoskeletal:     Cervical back: Neck supple.     Right lower leg: No edema.     Left lower leg: No edema.     Comments: Right upper extremity in sling.  Skin:    General: Skin is warm.     Capillary Refill: Capillary refill takes less than 2 seconds.  Neurological:     General: No focal deficit present.     Mental Status: He is alert and oriented to person, place, and time.     Cranial Nerves: No cranial nerve deficit.     Sensory: No sensory deficit.     Motor: No weakness.     ED Results / Procedures /  Treatments   Labs (all labs ordered are listed, but only abnormal results are displayed) Labs Reviewed  CBC - Abnormal; Notable for the following components:      Result Value   WBC 12.0 (*)    All other components within normal limits  COMPREHENSIVE METABOLIC PANEL WITH GFR - Abnormal; Notable for the following components:   CO2 20 (*)    Glucose, Bld 138 (*)    AST 14 (*)    All other components within normal limits  CULTURE, BLOOD (ROUTINE X 2)  CULTURE, BLOOD (ROUTINE X 2)  LACTIC ACID, PLASMA  PROTIME-INR  PRO BRAIN NATRIURETIC PEPTIDE  TROPONIN T, HIGH SENSITIVITY  TROPONIN T, HIGH SENSITIVITY    EKG EKG Interpretation Date/Time:  Friday February 27 2024 15:57:31 EDT Ventricular Rate:  79 PR Interval:  176 QRS Duration:  82 QT Interval:  378 QTC Calculation: 433 R Axis:   100  Text Interpretation: Normal sinus rhythm Rightward axis Possible Anterior infarct , age undetermined Abnormal ECG No previous ECGs available Confirmed by Racheal Buddle [phone]) on 02/27/2024 4:16:27 PM  Radiology CT Angio Chest PE W/Cm &/Or Wo Cm Result Date: 02/27/2024 CLINICAL DATA:  Chest pain, suspected pulmonary embolus EXAM: CT ANGIOGRAPHY CHEST WITH CONTRAST TECHNIQUE: Multidetector CT imaging of the chest was performed using the standard protocol during bolus administration of intravenous contrast. Multiplanar CT image reconstructions and MIPs were obtained to evaluate the vascular anatomy. RADIATION DOSE REDUCTION: This exam was performed according to the departmental dose-optimization program which includes automated exposure control, adjustment of the mA and/or kV according to patient size and/or use of iterative reconstruction technique. CONTRAST:  75mL OMNIPAQUE  IOHEXOL  350 MG/ML SOLN COMPARISON:  02/27/2024 FINDINGS: Cardiovascular: This is a technically adequate evaluation of the pulmonary vasculature. No filling defects or pulmonary emboli. The heart is unremarkable without pericardial  effusion. No evidence of thoracic aortic aneurysm or dissection. Mediastinum/Nodes: No enlarged mediastinal, hilar, or axillary lymph nodes. Thyroid gland, trachea, and esophagus demonstrate no significant findings. Lungs/Pleura: Stable fat containing right diaphragmatic hernia. Scattered areas of atelectasis or scarring at the lung bases. No acute airspace disease, effusion, or pneumothorax. Mild upper lobe predominant emphysema. Central airways are patent. Upper Abdomen: No acute abnormality. Musculoskeletal: No acute or destructive bony abnormalities. Reconstructed images demonstrate no additional findings. Review of the MIP images confirms the above findings. IMPRESSION: 1. No evidence of pulmonary embolus. 2. Bibasilar hypoventilatory changes.  No acute airspace disease. 3.  Emphysema (ICD10-J43.9). Electronically Signed   By: Bobbye Burrow M.D.   On: 02/27/2024 18:03   DG Chest Port 1 View Result Date: 02/27/2024 CLINICAL DATA:  [id number] Chest pain [id number] EXAM: PORTABLE CHEST -  1 VIEW COMPARISON:  October 30, 2015, June 07, 2019 FINDINGS: Low lung volumes.Streaky opacities in both lung bases, likely atelectasis. Bilateral perihilar interstitial opacities. no lobar pneumonia or pleural effusion. No pneumothorax. The cardiac silhouette is at the upper limits of normal, likely accentuated by AP technique and low lung volumes. No acute fracture or destructive lesions. IMPRESSION: Low lung volumes. With bilateral perihilar interstitial opacities, likely representing bronchovascular crowding. Alternatively, this could reflect interstitial edema or atypical/viral infection, in the correct clinical context. Electronically Signed   By: Rance Burrows M.D.   On: 02/27/2024 17:09    Procedures Procedures    Medications Ordered in ED Medications  sodium chloride  0.9 % bolus 1,000 mL (has no administration in time range)  ondansetron  (ZOFRAN ) injection 4 mg (has no administration in time range)  fentaNYL   (SUBLIMAZE ) injection 50 mcg (has no administration in time range)    ED Course/ Medical Decision Making/ A&P Clinical Course as of 02/28/24 1105  Fri Feb 27, 2024  1700 Patient states he feels a little bit better after the first dose of fentanyl .  Blood pressure has come up.  Rates his pain a 7 out of 10 now.  Initial workup so far has not given a definite answer.  Chest x-ray interpreted by me as no pneumothorax no acute infiltrate.  Have put him in for CT as he would be high risk with his recent surgery [MB]  2012 Patient states his pain is better with the pain medication.  His workup has been fairly unremarkable.  He is comfortable plan for discharge.  Will send prescription for pain medication to pharmacy.  Return instructions discussed [MB]    Clinical Course User Index [MB] Tonya Fredrickson, MD                                 Medical Decision Making Amount and/or Complexity of Data Reviewed Labs: ordered. Radiology: ordered.  Risk Prescription drug management.   This patient complains of severe upper back left scapular pain; this involves an extensive number of treatment Options and is a complaint that carries with it a high risk of complications and morbidity. The differential includes vascular, PE, musculoskeletal, radiculopathy, pneumonia, pneumothorax, ACS  I ordered, reviewed and interpreted labs, which included CBC with mildly elevated white count, chemistries with low bicarb, troponins flat, lactate normal, blood culture sent, BMP normal I ordered medication IV fluids IV pain medication and reviewed PMP when indicated. I ordered imaging studies which included chest x-ray, CT angio chest and I independently    visualized and interpreted imaging which showed no acute findings Additional history obtained from patient's wife Previous records obtained and reviewed in epic including recent operative notes Cardiac monitoring reviewed, sinus rhythm Social determinants  considered, no significant barriers Critical Interventions: None  After the interventions stated above, I reevaluated the patient and found patient's pain to be improved and hemodynamically stable Admission and further testing considered, no indications for admission as patient feels his pain is adequately controlled.  Recommended close follow-up with his treatment team.  Return instructions discussed.         Final Clinical Impression(s) / ED Diagnoses Final diagnoses:  Upper back pain on left side    Rx / DC Orders ED Discharge Orders          Ordered    oxyCODONE -acetaminophen  (PERCOCET/ROXICET) 5-325 MG tablet  Every 6 hours PRN  02/27/24 2015              Tonya Fredrickson, MD 02/28/24 [phone]

## 2024-03-01 DIAGNOSIS — M542 Cervicalgia: Secondary | ICD-10-CM | POA: Diagnosis not present

## 2024-03-03 LAB — CULTURE, BLOOD (ROUTINE X 2)
Culture: NO GROWTH
Culture: NO GROWTH

## 2024-03-05 DIAGNOSIS — M542 Cervicalgia: Secondary | ICD-10-CM | POA: Diagnosis not present

## 2024-03-08 DIAGNOSIS — M25611 Stiffness of right shoulder, not elsewhere classified: Secondary | ICD-10-CM | POA: Diagnosis not present

## 2024-03-08 DIAGNOSIS — M25511 Pain in right shoulder: Secondary | ICD-10-CM | POA: Diagnosis not present

## 2024-03-08 DIAGNOSIS — M6281 Muscle weakness (generalized): Secondary | ICD-10-CM | POA: Diagnosis not present

## 2024-03-09 DIAGNOSIS — Z4789 Encounter for other orthopedic aftercare: Secondary | ICD-10-CM | POA: Diagnosis not present

## 2024-03-09 DIAGNOSIS — M25512 Pain in left shoulder: Secondary | ICD-10-CM | POA: Diagnosis not present

## 2024-03-16 DIAGNOSIS — M25511 Pain in right shoulder: Secondary | ICD-10-CM | POA: Diagnosis not present

## 2024-03-16 DIAGNOSIS — M25611 Stiffness of right shoulder, not elsewhere classified: Secondary | ICD-10-CM | POA: Diagnosis not present

## 2024-03-16 DIAGNOSIS — M6281 Muscle weakness (generalized): Secondary | ICD-10-CM | POA: Diagnosis not present

## 2024-03-24 DIAGNOSIS — M6281 Muscle weakness (generalized): Secondary | ICD-10-CM | POA: Diagnosis not present

## 2024-03-24 DIAGNOSIS — M25511 Pain in right shoulder: Secondary | ICD-10-CM | POA: Diagnosis not present

## 2024-03-24 DIAGNOSIS — M25611 Stiffness of right shoulder, not elsewhere classified: Secondary | ICD-10-CM | POA: Diagnosis not present

## 2024-04-01 DIAGNOSIS — M25611 Stiffness of right shoulder, not elsewhere classified: Secondary | ICD-10-CM | POA: Diagnosis not present

## 2024-04-01 DIAGNOSIS — M6281 Muscle weakness (generalized): Secondary | ICD-10-CM | POA: Diagnosis not present

## 2024-04-12 DIAGNOSIS — M6281 Muscle weakness (generalized): Secondary | ICD-10-CM | POA: Diagnosis not present

## 2024-04-12 DIAGNOSIS — M25611 Stiffness of right shoulder, not elsewhere classified: Secondary | ICD-10-CM | POA: Diagnosis not present

## 2024-04-21 DIAGNOSIS — M6281 Muscle weakness (generalized): Secondary | ICD-10-CM | POA: Diagnosis not present

## 2024-04-21 DIAGNOSIS — M25611 Stiffness of right shoulder, not elsewhere classified: Secondary | ICD-10-CM | POA: Diagnosis not present

## 2024-04-28 DIAGNOSIS — M7552 Bursitis of left shoulder: Secondary | ICD-10-CM | POA: Diagnosis not present

## 2024-04-28 DIAGNOSIS — M75122 Complete rotator cuff tear or rupture of left shoulder, not specified as traumatic: Secondary | ICD-10-CM | POA: Diagnosis not present

## 2024-04-28 DIAGNOSIS — G8918 Other acute postprocedural pain: Secondary | ICD-10-CM | POA: Diagnosis not present

## 2024-04-28 DIAGNOSIS — S43432A Superior glenoid labrum lesion of left shoulder, initial encounter: Secondary | ICD-10-CM | POA: Diagnosis not present

## 2024-04-28 DIAGNOSIS — M24112 Other articular cartilage disorders, left shoulder: Secondary | ICD-10-CM | POA: Diagnosis not present

## 2024-04-28 DIAGNOSIS — M7522 Bicipital tendinitis, left shoulder: Secondary | ICD-10-CM | POA: Diagnosis not present

## 2024-05-12 DIAGNOSIS — M6281 Muscle weakness (generalized): Secondary | ICD-10-CM | POA: Diagnosis not present

## 2024-05-12 DIAGNOSIS — M25512 Pain in left shoulder: Secondary | ICD-10-CM | POA: Diagnosis not present

## 2024-05-12 DIAGNOSIS — M25612 Stiffness of left shoulder, not elsewhere classified: Secondary | ICD-10-CM | POA: Diagnosis not present

## 2024-05-17 DIAGNOSIS — M25512 Pain in left shoulder: Secondary | ICD-10-CM | POA: Diagnosis not present

## 2024-05-17 DIAGNOSIS — M6281 Muscle weakness (generalized): Secondary | ICD-10-CM | POA: Diagnosis not present

## 2024-05-17 DIAGNOSIS — M25612 Stiffness of left shoulder, not elsewhere classified: Secondary | ICD-10-CM | POA: Diagnosis not present

## 2024-05-24 DIAGNOSIS — M25612 Stiffness of left shoulder, not elsewhere classified: Secondary | ICD-10-CM | POA: Diagnosis not present

## 2024-05-24 DIAGNOSIS — M25512 Pain in left shoulder: Secondary | ICD-10-CM | POA: Diagnosis not present

## 2024-05-24 DIAGNOSIS — M6281 Muscle weakness (generalized): Secondary | ICD-10-CM | POA: Diagnosis not present

## 2024-05-31 DIAGNOSIS — M25612 Stiffness of left shoulder, not elsewhere classified: Secondary | ICD-10-CM | POA: Diagnosis not present

## 2024-05-31 DIAGNOSIS — M6281 Muscle weakness (generalized): Secondary | ICD-10-CM | POA: Diagnosis not present

## 2024-05-31 DIAGNOSIS — M25512 Pain in left shoulder: Secondary | ICD-10-CM | POA: Diagnosis not present

## 2024-06-07 ENCOUNTER — Telehealth: Payer: Self-pay

## 2024-06-07 NOTE — Telephone Encounter (Signed)
 Copied from CRM #[id number]. Topic: General - Other >> Jun 07, 2024  3:29 PM Deaijah H wrote: Reason for CRM: Patient stated he would like to have a Skin cancer exam & prostate check

## 2024-06-08 DIAGNOSIS — M25611 Stiffness of right shoulder, not elsewhere classified: Secondary | ICD-10-CM | POA: Diagnosis not present

## 2024-06-08 DIAGNOSIS — M25511 Pain in right shoulder: Secondary | ICD-10-CM | POA: Diagnosis not present

## 2024-06-09 ENCOUNTER — Ambulatory Visit (INDEPENDENT_AMBULATORY_CARE_PROVIDER_SITE_OTHER): Admitting: Family Medicine

## 2024-06-09 ENCOUNTER — Encounter: Payer: Self-pay | Admitting: Family Medicine

## 2024-06-09 VITALS — BP 120/80 | HR 58 | Temp 98.4°F | Ht 69.69 in | Wt 224.5 lb

## 2024-06-09 DIAGNOSIS — E785 Hyperlipidemia, unspecified: Secondary | ICD-10-CM | POA: Diagnosis not present

## 2024-06-09 DIAGNOSIS — Z0000 Encounter for general adult medical examination without abnormal findings: Secondary | ICD-10-CM

## 2024-06-09 NOTE — Patient Instructions (Signed)
Consider Prevnar 20 pneumonia vaccine.
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
## 2024-06-10 LAB — CBC WITH DIFFERENTIAL/PLATELET
Basophils Absolute: 0.1 K/uL (ref 0.0–0.1)
Basophils Relative: 1.4 % (ref 0.0–3.0)
Eosinophils Absolute: 0.2 K/uL (ref 0.0–0.7)
Eosinophils Relative: 2.6 % (ref 0.0–5.0)
HCT: 42.3 % (ref 39.0–52.0)
Hemoglobin: 14.2 g/dL (ref 13.0–17.0)
Lymphocytes Relative: 25.9 % (ref 12.0–46.0)
Lymphs Abs: 1.8 K/uL (ref 0.7–4.0)
MCHC: 33.6 g/dL (ref 30.0–36.0)
MCV: 90 fl (ref 78.0–100.0)
Monocytes Absolute: 0.8 K/uL (ref 0.1–1.0)
Monocytes Relative: 11.1 % (ref 3.0–12.0)
Neutro Abs: 4.1 K/uL (ref 1.4–7.7)
Neutrophils Relative %: 59 % (ref 43.0–77.0)
Platelets: 253 K/uL (ref 150.0–400.0)
RBC: 4.7 Mil/uL (ref 4.22–5.81)
RDW: 14.2 % (ref 11.5–15.5)
WBC: 7 K/uL (ref 4.0–10.5)

## 2024-06-10 LAB — COMPREHENSIVE METABOLIC PANEL WITH GFR
ALT: 21 U/L (ref 0–53)
AST: 16 U/L (ref 0–37)
Albumin: 4.7 g/dL (ref 3.5–5.2)
Alkaline Phosphatase: 71 U/L (ref 39–117)
BUN: 22 mg/dL (ref 6–23)
CO2: 22 meq/L (ref 19–32)
Calcium: 9.7 mg/dL (ref 8.4–10.5)
Chloride: 105 meq/L (ref 96–112)
Creatinine, Ser: 0.98 mg/dL (ref 0.40–1.50)
GFR: 82.02 mL/min (ref >60.00)
Glucose, Bld: 102 mg/dL — ABNORMAL HIGH (ref 70–99)
Potassium: 4.4 meq/L (ref 3.5–5.1)
Sodium: 143 meq/L (ref 135–145)
Total Bilirubin: 0.4 mg/dL (ref 0.2–1.2)
Total Protein: 7 g/dL (ref 6.0–8.3)

## 2024-06-10 LAB — LIPID PANEL
Cholesterol: 242 mg/dL — ABNORMAL HIGH (ref 0–200)
HDL: 76.8 mg/dL (ref >39.00)
LDL Cholesterol: 129 mg/dL — ABNORMAL HIGH (ref 0–99)
NonHDL: 165.04
Total CHOL/HDL Ratio: 3
Triglycerides: 182 mg/dL — ABNORMAL HIGH (ref 0.0–149.0)
VLDL: 36.4 mg/dL (ref 0.0–40.0)

## 2024-06-10 LAB — HEMOGLOBIN A1C: Hgb A1c MFr Bld: 5.8 % (ref 4.6–6.5)

## 2024-06-10 LAB — PSA: PSA: 0.74 ng/mL (ref 0.10–4.00)

## 2024-06-11 ENCOUNTER — Ambulatory Visit: Payer: Self-pay | Admitting: Family Medicine

## 2024-06-16 DIAGNOSIS — M25611 Stiffness of right shoulder, not elsewhere classified: Secondary | ICD-10-CM | POA: Diagnosis not present

## 2024-06-16 DIAGNOSIS — M25511 Pain in right shoulder: Secondary | ICD-10-CM | POA: Diagnosis not present

## 2024-06-23 DIAGNOSIS — M25611 Stiffness of right shoulder, not elsewhere classified: Secondary | ICD-10-CM | POA: Diagnosis not present

## 2024-06-23 DIAGNOSIS — M25511 Pain in right shoulder: Secondary | ICD-10-CM | POA: Diagnosis not present

## 2024-07-01 DIAGNOSIS — M25511 Pain in right shoulder: Secondary | ICD-10-CM | POA: Diagnosis not present

## 2024-07-01 DIAGNOSIS — M25611 Stiffness of right shoulder, not elsewhere classified: Secondary | ICD-10-CM | POA: Diagnosis not present

## 2024-07-19 ENCOUNTER — Ambulatory Visit (HOSPITAL_BASED_OUTPATIENT_CLINIC_OR_DEPARTMENT_OTHER)
Admission: RE | Admit: 2024-07-19 | Discharge: 2024-07-19 | Disposition: A | Discharge status: Home / Self Care | Payer: Self-pay | Source: Ambulatory Visit | Attending: Family Medicine | Admitting: Family Medicine

## 2024-07-19 DIAGNOSIS — M25612 Stiffness of left shoulder, not elsewhere classified: Secondary | ICD-10-CM | POA: Diagnosis not present

## 2024-07-19 DIAGNOSIS — E785 Hyperlipidemia, unspecified: Secondary | ICD-10-CM | POA: Insufficient documentation

## 2024-07-19 DIAGNOSIS — M25512 Pain in left shoulder: Secondary | ICD-10-CM | POA: Diagnosis not present

## 2024-07-19 DIAGNOSIS — M6281 Muscle weakness (generalized): Secondary | ICD-10-CM | POA: Diagnosis not present

## 2024-07-20 MED ORDER — ROSUVASTATIN CALCIUM 20 MG PO TABS: 20.0000 mg | ORAL_TABLET | Freq: Every day | ORAL | 0 refills | Status: DC

## 2024-07-20 NOTE — Addendum Note (Signed)
 Addended by: METTA KRISTEN CROME on: 07/20/2024 04:16 PM   Modules accepted: Orders

## 2024-07-22 ENCOUNTER — Encounter: Payer: Self-pay | Admitting: Family Medicine

## 2024-07-22 DIAGNOSIS — M25511 Pain in right shoulder: Secondary | ICD-10-CM | POA: Diagnosis not present

## 2024-07-26 DIAGNOSIS — M25612 Stiffness of left shoulder, not elsewhere classified: Secondary | ICD-10-CM | POA: Diagnosis not present

## 2024-07-26 DIAGNOSIS — M25512 Pain in left shoulder: Secondary | ICD-10-CM | POA: Diagnosis not present

## 2024-07-26 DIAGNOSIS — M6281 Muscle weakness (generalized): Secondary | ICD-10-CM | POA: Diagnosis not present

## 2024-08-03 ENCOUNTER — Ambulatory Visit (INDEPENDENT_AMBULATORY_CARE_PROVIDER_SITE_OTHER): Admitting: Family Medicine

## 2024-08-03 ENCOUNTER — Encounter: Payer: Self-pay | Admitting: Family Medicine

## 2024-08-03 VITALS — BP 122/80 | HR 62 | Temp 98.3°F | Wt 220.7 lb

## 2024-08-03 DIAGNOSIS — E785 Hyperlipidemia, unspecified: Secondary | ICD-10-CM

## 2024-08-03 DIAGNOSIS — R931 Abnormal findings on diagnostic imaging of heart and coronary circulation: Secondary | ICD-10-CM | POA: Diagnosis not present

## 2024-08-03 NOTE — Patient Instructions (Signed)
 Consider baby aspirin one daily  Set up for repeat labs in about 6 weeks  I am setting up cardiology referral.
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
## 2024-09-23 DIAGNOSIS — Z9889 Other specified postprocedural states: Secondary | ICD-10-CM | POA: Diagnosis not present

## 2024-09-23 DIAGNOSIS — M25512 Pain in left shoulder: Secondary | ICD-10-CM | POA: Diagnosis not present

## 2024-09-27 ENCOUNTER — Encounter: Payer: Self-pay | Admitting: Family Medicine

## 2024-09-27 ENCOUNTER — Ambulatory Visit (INDEPENDENT_AMBULATORY_CARE_PROVIDER_SITE_OTHER): Admitting: Family Medicine

## 2024-09-27 VITALS — BP 100/70 | HR 84 | Temp 98.3°F | Wt 220.0 lb

## 2024-09-27 DIAGNOSIS — R300 Dysuria: Secondary | ICD-10-CM | POA: Diagnosis not present

## 2024-09-27 LAB — POC URINALSYSI DIPSTICK (AUTOMATED)
Glucose, UA: NEGATIVE
Nitrite, UA: POSITIVE
Protein, UA: POSITIVE — AB
Spec Grav, UA: >=1.03 — AB (ref 1.010–1.025)
Urobilinogen, UA: 4 U/dL — AB
pH, UA: 5 (ref 5.0–8.0)

## 2024-09-27 MED ORDER — CIPROFLOXACIN HCL 500 MG PO TABS: 500.0000 mg | ORAL_TABLET | Freq: Two times a day (BID) | ORAL | 0 refills | Status: AC

## 2024-09-27 NOTE — Progress Notes (Signed)
 Established Patient Office Visit  Subjective   Patient ID: Joshua Ellison, male    DOB: 06/17/1961  Age: 63 y.o. MRN: [id number]  Chief Complaint  Patient presents with   Dysuria    HPI   Joshua Ellison is seen with 1 day history of urine frequency and some burning with urination.  Started over-the-counter Pyridium yesterday or last night which seemed to help somewhat.  He states he had some chills last night but none today.  Drinking fluids well.  No flank pain.  No vomiting.  Wife currently hospitalized following stroke which followed knee surgery.  He thinks he may have had 1 remote UTI years ago but none recently.  Past Medical History:  Diagnosis Date   GERD (gastroesophageal reflux disease)    SUPRAPUBIC PAIN 02/14/2009   Past Surgical History:  Procedure Laterality Date   COLONOSCOPY     INSERTION OF MESH N/A 10/20/2014   Procedure: INSERTION OF MESH;  Surgeon: Vicenta Poli, MD;  Location: [facility] OR;  Service: General;  Laterality: N/A;   KNEE ARTHROSCOPY W/ ACL RECONSTRUCTION Right 1995   POLYPECTOMY     SHOULDER ARTHROSCOPY WITH ROTATOR CUFF REPAIR Right 01/23/2024   Procedure: ARTHROSCOPY, SHOULDER, WITH ROTATOR CUFF REPAIR and BICEP TENODESIS;  Surgeon: Sharl Selinda Dover, MD;  Location: [facility];  Service: Orthopedics;  Laterality: Right;  biceps tenodesis and subacromial decompression   TONSILLECTOMY  1970   UMBILICAL HERNIA REPAIR N/A 10/20/2014   Procedure: OPEN UMBILICAL HERNIA REPAIR WITH MESH;  Surgeon: Vicenta Poli, MD;  Location: [facility] OR;  Service: General;  Laterality: N/A;   VASECTOMY  2007    reports that he quit smoking about 20 years ago. His smoking use included cigarettes. He started smoking about 47 years ago. He has a 54 pack-year smoking history. He has never used smokeless tobacco. He reports current alcohol use of about 14.0 standard drinks of alcohol per week. He reports that he does not use drugs. family history includes Bladder Cancer  in his father; COPD in his paternal grandmother; Diabetes in his father; Hyperlipidemia in his mother; Prostate cancer in his paternal grandfather; Stomach cancer in his maternal grandmother. No Known Allergies  Review of Systems  Constitutional:  Negative for chills and fever.  Gastrointestinal:  Negative for nausea and vomiting.  Genitourinary:  Positive for dysuria and frequency. Negative for flank pain.      Objective:     BP 100/70   Pulse 84   Temp 98.3 F (36.8 C) (Oral)   Wt 220 lb (99.8 kg)   SpO2 97%   BMI 31.85 kg/m  BP Readings from Last 3 Encounters:  09/27/24 100/70  08/03/24 122/80  06/09/24 120/80   Wt Readings from Last 3 Encounters:  09/27/24 220 lb (99.8 kg)  08/03/24 220 lb 11.2 oz (100.1 kg)  06/09/24 224 lb 8 oz (101.8 kg)      Physical Exam Vitals reviewed.  Constitutional:      General: He is not in acute distress.    Appearance: He is not ill-appearing.  Cardiovascular:     Rate and Rhythm: Normal rate and regular rhythm.  Pulmonary:     Effort: Pulmonary effort is normal.     Breath sounds: Normal breath sounds.  Neurological:     Mental Status: He is alert.      Results for orders placed or performed in visit on 09/27/24  POCT Urinalysis Dipstick (Automated)  Result Value Ref Range   Color, UA red  Clarity, UA cloudy    Glucose, UA Negative Negative   Bilirubin, UA 3+    Ketones, UA 1+    Spec Grav, UA >=1.030 (A) 1.010 - 1.025   Blood, UA 1+    pH, UA 5.0 5.0 - 8.0   Protein, UA Positive (A) Negative   Urobilinogen, UA 4.0 (A) 0.2 or 1.0 E.U./dL   Nitrite, UA positive    Leukocytes, UA Large (3+) (A) Negative      The 10-year ASCVD risk score (Arnett DK, et al., 2019) is: 6.4%    Assessment & Plan:   Problem List Items Addressed This Visit   None Visit Diagnoses       Dysuria    -  Primary   Relevant Orders   POCT Urinalysis Dipstick (Automated) (Completed)   Urine Culture     Probable uncomplicated  cystitis.  Very abnormal urine dipstick although he did take Pyridium.  Urine culture sent.  Start Cipro  500 mg twice daily for 7 days pending culture results.  Stressed importance of good hydration.  Follow-up for any persistent or worsening symptoms.  No follow-ups on file.    Joshua Scarlet, MD

## 2024-09-28 ENCOUNTER — Encounter: Payer: Self-pay | Admitting: Family Medicine

## 2024-09-28 ENCOUNTER — Other Ambulatory Visit: Payer: Self-pay | Admitting: Family Medicine

## 2024-09-28 NOTE — Telephone Encounter (Signed)
 Patient reported he has picked up prescription and given clarification on directions. Patient reported he is still having chills and discomfort and inquired if he can take anything for his bladder discomfort?

## 2024-09-29 ENCOUNTER — Ambulatory Visit: Payer: Self-pay

## 2024-09-29 ENCOUNTER — Ambulatory Visit: Payer: Self-pay | Admitting: Family Medicine

## 2024-09-29 LAB — URINE CULTURE
MICRO NUMBER:: 17275423
Result:: NO GROWTH
SPECIMEN QUALITY:: ADEQUATE

## 2024-09-29 NOTE — Telephone Encounter (Signed)
 Spoke to patient and he didn't pick up Pyridium. Checked and patient picked up ciprofloxacin . Please advise further.

## 2024-09-29 NOTE — Telephone Encounter (Signed)
 FYI Only or Action Required?: Action required by provider: update on patient condition.  Patient was last seen in primary care on 09/27/2024 by Micheal Wolm ORN, MD.  Called Nurse Triage reporting Urinary Frequency.  Symptoms began several days ago.  Interventions attempted: OTC medications: pyridium and Prescription medications: Cipro .  Symptoms are: gradually improving.  Triage Disposition: Home Care  Patient/caregiver understands and will follow disposition?: Yes  Copied from CRM #[id number]. Topic: Clinical - Red Word Triage >> Sep 29, 2024 12:56 PM China J wrote: [facility] that prompted transfer to Nurse Triage: Bladder discomfort. Patient needs to know if he should continue to take the box medication he showed Dr. Micheal that was equivalent to Pyridium Reason for Disposition  [1] Taking antibiotic < 72 hours (3 days) for UTI AND [2] painful urination or frequency is SAME (unchanged, not better)  Answer Assessment - Initial Assessment Questions 1. MAIN SYMPTOM: What is the main symptom you are concerned about? (e.g., painful urination, urine frequency)     Pain in penis when urinating. 2. BETTER-SAME-WORSE: Are you getting better, staying the same, or getting worse compared to how you felt at your last visit to the doctor (most recent medical visit)?     Frequency has gotten better,  pain really hadn't. 3. PAIN: How bad is the pain?  (e.g., Scale 1-10; mild, moderate, or severe)     4-6 4. FEVER: Do you have a fever? If Yes, ask: What is it, how was it measured, and when did it start?     Has not checked today. Denies any symptoms today of a fever. Had one yesterday. 5. OTHER SYMPTOMS: Do you have any other symptoms? (e.g., blood in the urine, flank pain, scrotal pain or swelling)     Penial pain 6. DIAGNOSIS: When was the UTI diagnosed? By whom? Was it a kidney infection, bladder infection or both?     Dysuria, Dr Micheal 7. ANTIBIOTIC: What  antibiotic(s) are you taking? How many times per day?     Cipro  2X a day 8. ANTIBIOTIC - START DATE: When did you start taking the antibiotic?     Monday 11/24  Answer Assessment - Initial Assessment Questions 1. SYMPTOM: What's the main symptom you're concerned about? (e.g., frequency, incontinence)     bladder 3. PAIN: Is there any pain? If Yes, ask: How bad is it? (Scale: 1-10; mild, moderate, severe)     4-6. Taking tylenol  every 4 hours. 4. CAUSE: What do you think is causing the symptoms?     Seen for urinary issues recently. 5. OTHER SYMPTOMS: Do you have any other symptoms? (e.g., blood in urine, fever, flank pain, pain with urination)     Had a fever yesterday of 101. Hasn't checked today for fever.  Protocols used: Urinary Symptoms-A-AH, Urinary Tract Infection on Antibiotic Follow-up Call - Male-A-AH

## 2024-10-04 NOTE — Telephone Encounter (Signed)
 Follow up with pt. Pt reports he is doing better and not having any more sx. Pt has no further questions.

## 2024-10-09 ENCOUNTER — Other Ambulatory Visit: Payer: Self-pay | Admitting: Family Medicine

## 2024-10-11 ENCOUNTER — Encounter: Payer: Self-pay | Admitting: Family Medicine

## 2024-10-11 MED ORDER — TADALAFIL 20 MG PO TABS: ORAL_TABLET | ORAL | 5 refills | Status: AC

## 2024-10-12 ENCOUNTER — Telehealth: Payer: Self-pay

## 2024-10-12 ENCOUNTER — Other Ambulatory Visit (HOSPITAL_COMMUNITY): Payer: Self-pay

## 2024-10-12 NOTE — Telephone Encounter (Signed)
 Pharmacy Patient Advocate Encounter   Received notification from Onbase that prior authorization for Tadalafil  20 tabs is required/requested.   Insurance verification completed.   The patient is insured through [employer].   Per test claim: PA required; PA submitted to above mentioned insurance via Latent Key/confirmation #/[id number] Status is pending

## 2024-10-13 ENCOUNTER — Other Ambulatory Visit (HOSPITAL_COMMUNITY): Payer: Self-pay

## 2024-10-13 NOTE — Telephone Encounter (Signed)
 Pharmacy Patient Advocate Encounter  Received notification from [employer] that Prior Authorization for Tadalafil  20 tabs has been APPROVED from 09/12/24 to 10/12/24. Ran test claim, Copay is $7.47 for 8 tablets per 30 days. This test claim was processed through [facility]- copay amounts may vary at other pharmacies due to pharmacy/plan contracts, or as the patient moves through the different stages of their insurance plan.   PA #/Case ID/Reference #: # [id number]

## 2024-10-26 DIAGNOSIS — M25522 Pain in left elbow: Secondary | ICD-10-CM | POA: Diagnosis not present

## 2024-11-02 DIAGNOSIS — M25522 Pain in left elbow: Secondary | ICD-10-CM | POA: Diagnosis not present

## 2024-11-26 ENCOUNTER — Other Ambulatory Visit

## 2024-11-26 ENCOUNTER — Other Ambulatory Visit: Payer: Self-pay

## 2024-11-26 DIAGNOSIS — E785 Hyperlipidemia, unspecified: Secondary | ICD-10-CM

## 2024-11-27 LAB — HEPATIC FUNCTION PANEL
AG Ratio: 2.3 (calc) (ref 1.0–2.5)
ALT: 24 U/L (ref 9–46)
AST: 15 U/L (ref 10–35)
Albumin: 4.6 g/dL (ref 3.6–5.1)
Alkaline phosphatase (APISO): 66 U/L (ref 35–144)
Bilirubin, Direct: 0.1 mg/dL (ref 0.0–0.2)
Globulin: 2 g/dL (ref 1.9–3.7)
Indirect Bilirubin: 0.3 mg/dL (ref 0.2–1.2)
Total Bilirubin: 0.4 mg/dL (ref 0.2–1.2)
Total Protein: 6.6 g/dL (ref 6.1–8.1)

## 2024-11-27 LAB — LIPID PANEL
Cholesterol: 173 mg/dL
HDL: 71 mg/dL
LDL Cholesterol (Calc): 68 mg/dL
Non-HDL Cholesterol (Calc): 102 mg/dL
Total CHOL/HDL Ratio: 2.4 (calc)
Triglycerides: 242 mg/dL — ABNORMAL HIGH

## 2024-11-29 ENCOUNTER — Ambulatory Visit: Payer: Self-pay | Admitting: Family Medicine

## 2024-12-01 ENCOUNTER — Encounter (HOSPITAL_BASED_OUTPATIENT_CLINIC_OR_DEPARTMENT_OTHER): Payer: Self-pay

## 2024-12-02 ENCOUNTER — Ambulatory Visit (HOSPITAL_BASED_OUTPATIENT_CLINIC_OR_DEPARTMENT_OTHER): Admitting: Cardiology

## 2024-12-02 ENCOUNTER — Encounter (HOSPITAL_BASED_OUTPATIENT_CLINIC_OR_DEPARTMENT_OTHER): Payer: Self-pay | Admitting: Cardiology

## 2024-12-02 VITALS — BP 112/72 | HR 69 | Ht 70.0 in | Wt 223.3 lb

## 2024-12-02 DIAGNOSIS — R7303 Prediabetes: Secondary | ICD-10-CM | POA: Diagnosis not present

## 2024-12-02 DIAGNOSIS — E7800 Pure hypercholesterolemia, unspecified: Secondary | ICD-10-CM

## 2024-12-02 DIAGNOSIS — R931 Abnormal findings on diagnostic imaging of heart and coronary circulation: Secondary | ICD-10-CM | POA: Diagnosis not present

## 2024-12-02 NOTE — Progress Notes (Signed)
 " Cardiology Office Note:  .   Date:  12/02/2024  ID:  Joshua Ellison, DOB Apr 21, 1961, MRN [id number] PCP: Micheal Wolm ORN, MD  [facility] Providers Cardiologist:  None     History of Present Illness: .   Joshua Ellison is a 64 y.o. male Discussed the use of AI scribe  History of Present Illness Joshua Ellison is a 64 year old male with coronary artery disease who presents for evaluation of a high coronary calcium  score. He was referred by Dr. Micheal for evaluation of coronary artery disease.  Coronary artery disease and coronary calcium  score - High coronary calcium  score of 688 (91st percentile) - No current chest pain or other cardiac symptoms - Referred for evaluation of coronary artery disease  Dyslipidemia - History of elevated cholesterol - On Crestor  for 4-5 months - Recent LDL of 68 - Good HDL and favorable cholesterol ratio - Daily baby aspirin (81 mg) - Occasional morning foot pain and tingling, suspected to be related to Crestor   Structural heart abnormalities - Bicuspid aortic valve - Patent foramen ovale (PFO) scheduled for repair in March  Cerebrovascular disease - History of stroke following total knee replacement, attributed to clot passing through PFO - Left peripheral vision loss due to stroke - On Eliquis for anticoagulation  Lifestyle and risk factors - Former smoker, quit 25 years ago - Directv rich in fish and olive oil, occasional fried foods  I take care of his wife, Joshua Ellison, who has bicuspid aortic valve and had a stroke postoperative knee replacement felt to be secondary to paradoxical emboli across PFO.    Studies Reviewed: .        Results Labs ALT: 24 LDL (11/26/2024): 68 Troponin (02/2024): Within normal limits Creatinine: 0.98 Hemoglobin A1c: 5.8  Radiology Coronary artery calcium  score: Calcium  score 688, 91st percentile, coronary atherosclerosis, ascending aorta 4.3 cm, aortic atherosclerosis, no other incidental  findings  Diagnostic EKG (02/2024): Sinus rhythm, rightward axis, poor R wave progression Risk Assessment/Calculations:            Physical Exam:   VS:  BP 112/72 (BP Location: Left Arm, Patient Position: Sitting, Cuff Size: Large)   Pulse 69   Ht 5' 10 (1.778 m)   Wt 223 lb 4.8 oz (101.3 kg)   SpO2 98%   BMI 32.04 kg/m    Wt Readings from Last 3 Encounters:  12/02/24 223 lb 4.8 oz (101.3 kg)  09/27/24 220 lb (99.8 kg)  08/03/24 220 lb 11.2 oz (100.1 kg)    GEN: Well nourished, well developed in no acute distress NECK: No JVD; No carotid bruits CARDIAC: RRR, no murmurs, no rubs, no gallops RESPIRATORY:  Clear to auscultation without rales, wheezing or rhonchi  ABDOMEN: Soft, non-tender, non-distended EXTREMITIES:  No edema; No deformity   ASSESSMENT AND PLAN: .    Assessment and Plan Assessment & Plan Coronary artery disease with high coronary calcium  score and aortic atherosclerosis Coronary artery disease with a high coronary calcium  score of 688, indicating significant atherosclerosis. No current symptoms related to coronary artery disease reported, but patient has residual visual deficit from prior ischemic stroke and reports tingling and numbness in feet upon waking that resolves after a few minutes. Family history of coronary artery disease with mother having undergone quadruple bypass and aortic valve replacement. LDL cholesterol is well-controlled at 68 mg/dL with Crestor . Discussed the importance of maintaining LDL below 70 mg/dL, with a potential target of less than 55 mg/dL due to high-risk status.  Discussed the role of Crestor  in stabilizing plaque and reducing inflammation. Consideration of coronary CT scan with contrast to assess for coronary artery narrowing or blockage. Discussed potential need for invasive catheterization if CT scan is inconclusive. Risks of invasive catheterization include stroke, heart attack, and death, with a risk of less than 1 in 1000. -  Ordered coronary CT scan with contrast to assess coronary arteries. - Continue Crestor  for cholesterol management. - Encouraged Mediterranean diet and increased fish intake. - Continue aspirin 81 mg daily.  Possible statin-induced myalgias/neuropathy Reports of foot pain and tingling upon waking, potentially related to Crestor  use. Symptoms resolve after a few minutes. Discussed possibility of statin-induced myalgias/neuropathy. Plan to monitor symptoms and consider a Crestor  holiday if symptoms worsen. - Monitor symptoms and consider Crestor  holiday if symptoms worsen.  Prediabetes Hemoglobin A1c is 5.8%, indicating prediabetes. Family history of diabetes. Discussed importance of lifestyle modifications to prevent progression to diabetes. - Encouraged lifestyle modifications including diet and exercise.         Dispo: We will follow-up with results of testing.  May be a good idea to be more aggressive with her LDL target to less than 55.  If necessary can add Zetia 10 mg.  Signed, Oneil Parchment, MD  "

## 2024-12-02 NOTE — Patient Instructions (Addendum)
 Medication Instructions:  No changes *If you need a refill on your cardiac medications before your next appointment, please call your pharmacy*  Lab Work: none If you have labs (blood work) drawn today and your tests are completely normal, you will receive your results only by: MyChart Message (if you have MyChart) OR A paper copy in the mail If you have any lab test that is abnormal or we need to change your treatment, we will call you to review the results.  Testing/Procedures: Coronary Calcium  Score CT  Follow-Up: At [facility], you and your health needs are our priority.  As part of our continuing mission to provide you with exceptional heart care, our providers are all part of one team.  This team includes your primary Cardiologist (physician) and Advanced Practice Providers or APPs (Physician Assistants and Nurse Practitioners) who all work together to provide you with the care you need, when you need it.  Your next appointment:   12 month(s)  Provider:   Oneil Parchment, MD      Your cardiac CT will be scheduled at one of the below locations:   If scheduled at the Heart and Vascular Tower at [address], please enter the parking lot using the [address] entrance and use the FREE valet service at the patient drop-off area. Enter the building and check-in with registration on the main floor.  Please follow these instructions carefully (unless otherwise directed):  An IV will be required for this test and Nitroglycerin will be given.  Hold all erectile dysfunction medications at least 3 days (72 hrs) prior to test. (Ie viagra, cialis , sildenafil, tadalafil , etc)   On the Night Before the Test: Be sure to Drink plenty of water. Do not consume any caffeinated/decaffeinated beverages or chocolate 12 hours prior to your test. Do not take any antihistamines 12 hours prior to your test.   On the Day of the Test: Drink plenty of water until 1 hour prior to the  test. Do not eat any food 1 hour prior to test. You may take your regular medications prior to the test.  Patients who wear a continuous glucose monitor MUST remove the device prior to scanning.      After the Test: Drink plenty of water. After receiving IV contrast, you may experience a mild flushed feeling. This is normal. On occasion, you may experience a mild rash up to 24 hours after the test. This is not dangerous. If this occurs, you can take Benadryl 25 mg, Zyrtec, Claritin, or Allegra and increase your fluid intake. (Patients taking Tikosyn should avoid Benadryl, and may take Zyrtec, Claritin, or Allegra) If you experience trouble breathing, this can be serious. If it is severe call 911 IMMEDIATELY. If it is mild, please call our office.  We will call to schedule your test 2-4 weeks out understanding that some insurance companies will need an authorization prior to the service being performed.   For more information and frequently asked questions, please visit our website : [email or web address]  For non-scheduling related questions, please contact the cardiac imaging nurse navigator should you have any questions/concerns: Cardiac Imaging Nurse Navigators Direct Office Dial: [phone]   For scheduling needs, including cancellations and rescheduling, please call Brittany, [phone].  For billing questions, please call [phone].

## 2024-12-21 ENCOUNTER — Ambulatory Visit (HOSPITAL_COMMUNITY)
